# Patient Record
Sex: Male | Born: 1985 | Race: Black or African American | Hispanic: No | Marital: Single | Smoking: Former smoker
Health system: Southern US, Community
[De-identification: ages and names within clinical notes are randomized; demographics above are authoritative.]

## PROBLEM LIST (undated history)

## (undated) DIAGNOSIS — I219 Acute myocardial infarction, unspecified: Secondary | ICD-10-CM

## (undated) HISTORY — PX: HERNIA REPAIR: SHX51

---

## 2016-11-20 ENCOUNTER — Emergency Department (HOSPITAL_COMMUNITY)
Admission: EM | Admit: 2016-11-20 | Discharge: 2016-11-20 | Disposition: A | Discharge status: Home / Self Care | Payer: Managed Care, Other (non HMO) | Attending: Emergency Medicine | Admitting: Emergency Medicine

## 2016-11-20 ENCOUNTER — Encounter (HOSPITAL_COMMUNITY): Payer: Self-pay | Admitting: *Deleted

## 2016-11-20 DIAGNOSIS — M542 Cervicalgia: Secondary | ICD-10-CM | POA: Diagnosis not present

## 2016-11-20 DIAGNOSIS — I252 Old myocardial infarction: Secondary | ICD-10-CM | POA: Diagnosis not present

## 2016-11-20 DIAGNOSIS — Z87891 Personal history of nicotine dependence: Secondary | ICD-10-CM | POA: Insufficient documentation

## 2016-11-20 DIAGNOSIS — M546 Pain in thoracic spine: Secondary | ICD-10-CM | POA: Diagnosis not present

## 2016-11-20 DIAGNOSIS — I10 Essential (primary) hypertension: Secondary | ICD-10-CM

## 2016-11-20 DIAGNOSIS — M549 Dorsalgia, unspecified: Secondary | ICD-10-CM

## 2016-11-20 HISTORY — DX: Acute myocardial infarction, unspecified: I21.9

## 2016-11-20 MED ORDER — MELOXICAM 15 MG PO TABS: 15.0000 mg | ORAL_TABLET | Freq: Every day | ORAL | 0 refills | Status: DC | PRN
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
## 2016-11-20 NOTE — ED Triage Notes (Signed)
Pt reports mid back pain and neck pain for weeks, thinks its related to heavy lifting at work. Denies specific injury. Is ambulatory at triage.

## 2016-11-20 NOTE — ED Notes (Signed)
Right care pamphlet given to patient with discharge paperwork

## 2016-11-20 NOTE — Discharge Instructions (Signed)
[facility] [address], [address] [place], [place] [address] [phone]   SEEK IMMEDIATE MEDICAL ATTENTION IF: New numbness, tingling, weakness, or problem with the use of your arms or legs.  Severe back pain not relieved with medications.  Change in bowel or bladder control.  Increasing pain in any areas of the body (such as chest or abdominal pain).  Shortness of breath, dizziness or fainting.  Nausea (feeling sick to your stomach), vomiting, fever, or sweats.

## 2018-02-20 ENCOUNTER — Emergency Department (HOSPITAL_COMMUNITY)
Admission: EM | Admit: 2018-02-20 | Discharge: 2018-02-20 | Disposition: A | Discharge status: Home / Self Care | Payer: Managed Care, Other (non HMO) | Attending: Emergency Medicine | Admitting: Emergency Medicine

## 2018-02-20 ENCOUNTER — Encounter (HOSPITAL_COMMUNITY): Payer: Self-pay

## 2018-02-20 DIAGNOSIS — R51 Headache: Secondary | ICD-10-CM | POA: Insufficient documentation

## 2018-02-20 DIAGNOSIS — R519 Headache, unspecified: Secondary | ICD-10-CM

## 2018-02-20 DIAGNOSIS — I252 Old myocardial infarction: Secondary | ICD-10-CM | POA: Insufficient documentation

## 2018-02-20 DIAGNOSIS — Z87891 Personal history of nicotine dependence: Secondary | ICD-10-CM | POA: Insufficient documentation

## 2018-02-20 NOTE — ED Provider Notes (Signed)
[facility] Provider Note   CSN: [id number] Arrival date & time: 02/20/18  1121     History   Chief Complaint Chief Complaint  Patient presents with  . Headache    HPI Thomas Tucker is a 32 y.o. male.  The history is provided by the patient. No language interpreter was used.  Headache       32 year old male with history of prior MI presenting for evaluation of headache.  Patient report having a throbbing frontal headache earlier today while at work that has since resolved.  However he is here at the recommendation of his supervisor to ensure that he is medically cleared to return back to work.  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  He has been working outside 10 hours a day all week long and today his headache began after being outside in the heat for approximately 7 hours.  He did receive an Aleve from his supervisor and after resting for approximately 1 hour his headache is mostly resolved.  He denies any associated fever, vision changes, confusion, nausea, lightheadedness, dizziness, neck pain, chest pain, trouble breathing, abdominal pain, focal numbness or weakness or rash.  No prior history of IV drug use active cancer.  He mentioned his prior MI back in 2014 was actually coronary spasm after vascular study.  He has no other complications since.  Past Medical History:  Diagnosis Date  . Myocardial infarction (HCC)     There are no active problems to display for this patient.   History reviewed. No pertinent surgical history.      Home Medications    Prior to Admission medications   Not on File    Family History No family history on file.  Social History Social History   Tobacco Use  . Smoking status: Former Smoker  Substance Use Topics  . Alcohol use: Yes  . Drug use: Yes    Types: Marijuana     Allergies   Patient has no known allergies.   Review of Systems Review of Systems    Neurological: Positive for headaches.  All other systems reviewed and are negative.    Physical Exam Updated Vital Signs BP (!) 126/96 (BP Location: Right Arm)   Pulse (!) 58   Temp 98.1 F (36.7 C) (Oral)   Resp 16   SpO2 100%   Physical Exam  Constitutional: He is oriented to person, place, and time. He appears well-developed and well-nourished. No distress.  HENT:  Head: Atraumatic.  Eyes: Conjunctivae and EOM are normal.  Neck: Normal range of motion. Neck supple. No neck rigidity.  Cardiovascular: Normal rate and regular rhythm.  Pulmonary/Chest: Effort normal and breath sounds normal.  Abdominal: Soft. There is no tenderness.  Musculoskeletal: Normal range of motion.  Neurological: He is alert and oriented to person, place, and time. He has normal strength. He displays normal reflexes. No cranial nerve deficit or sensory deficit. He displays a negative Romberg sign. GCS eye subscore is 4. GCS verbal subscore is 5. GCS motor subscore is 6.  Skin: Skin is warm. No rash noted.  Psychiatric: He has a normal mood and affect.  Nursing note and vitals reviewed.    ED Treatments / Results  Labs (all labs ordered are listed, but only abnormal results are displayed) Labs Reviewed - No data to display  EKG None  Radiology No results found.  Procedures Procedures (including critical care time)  Medications Ordered in ED  Medications - No data to display   Initial Impression / Assessment and Plan / ED Course  I have reviewed the triage vital signs and the nursing notes.  Pertinent labs & imaging results that were available during my care of the patient were reviewed by me and considered in my medical decision making (see chart for details).     BP (!) 126/96 (BP Location: Right Arm)   Pulse (!) 58   Temp 98.1 F (36.7 C) (Oral)   Resp 16   SpO2 100%    Final Clinical Impressions(s) / ED Diagnoses   Final diagnoses:  Bad headache    ED Discharge Orders     None     1:45 PM Patient here with headache likely tension headache.  He has no focal neuro deficit on exam.  No concerning red flags.  His headache is mostly resolved after taking one Advil earlier in the day.  He is stable for discharge.  Work note provided.  He may return to work without restriction.   Fayrene Helperran, Jacqulynn Shappell, PA-C 02/20/18 1348    Vanetta MuldersZackowski, Scott, MD 02/25/18 [phone]

## 2018-02-20 NOTE — ED Triage Notes (Signed)
Pt presents for evaluation of headache starting today. Pt reports he works outside and started having a headache, took aleve and it decreased from 7/10 to 4/10. His job wanted him evaluated.

## 2018-03-24 ENCOUNTER — Other Ambulatory Visit: Payer: Self-pay

## 2018-03-24 ENCOUNTER — Encounter (HOSPITAL_COMMUNITY): Payer: Self-pay | Admitting: Emergency Medicine

## 2018-03-24 ENCOUNTER — Ambulatory Visit (HOSPITAL_COMMUNITY)
Admission: EM | Admit: 2018-03-24 | Discharge: 2018-03-24 | Disposition: A | Discharge status: Home / Self Care | Payer: Self-pay | Attending: Family Medicine | Admitting: Family Medicine

## 2018-03-24 DIAGNOSIS — S39012A Strain of muscle, fascia and tendon of lower back, initial encounter: Secondary | ICD-10-CM

## 2018-03-24 DIAGNOSIS — L84 Corns and callosities: Secondary | ICD-10-CM

## 2018-03-24 MED ORDER — TRIAMCINOLONE ACETONIDE 0.1 % EX CREA: 1.0000 "application " | TOPICAL_CREAM | Freq: Two times a day (BID) | CUTANEOUS | 1 refills | Status: DC

## 2018-03-24 MED ORDER — DICLOFENAC SODIUM 75 MG PO TBEC: 75.0000 mg | DELAYED_RELEASE_TABLET | ORAL | 1 refills | Status: DC

## 2018-03-24 NOTE — ED Triage Notes (Signed)
Complains of bilateral foot pain and back pain.  Pain for 2-3 months.  Pains seemed worse over the past 2-3 days.  Patient says lower back hurts and bilateral heel pain, but no radiation of pain down the leg

## 2018-03-24 NOTE — ED Provider Notes (Signed)
[facility]    CSN: [id number] Arrival date & time: 03/24/18  1112     History   Chief Complaint Chief Complaint  Patient presents with  . Back Pain    HPI Thomas Tucker is a 32 y.o. male.   Complains of bilateral foot pain and back pain.  Pain for 2-3 months.  Pains seemed worse over the past 2-3 days.  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  He finds that he is developing calluses on his feet and some of these are cracking, particularly on the left heel.  He has mild low back pain as well.  Patient had a heart attack when he was 24 and was told that this was because of smoking.  He said that they did studies showing there was no blockage.   Note from 02/20/18: 32 year old male with history of prior MI presenting for evaluation of headache.  Patient report having a throbbing frontal headache earlier today while at work that has since resolved.  However he is here at the recommendation of his supervisor to ensure that he is medically cleared to return back to work.  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  He has been working outside 10 hours a day all week long and today his headache began after being outside in the heat for approximately 7 hours.  He did receive an Aleve from his supervisor and after resting for approximately 1 hour his headache is mostly resolved.  He denies any associated fever, vision changes, confusion, nausea, lightheadedness, dizziness, neck pain, chest pain, trouble breathing, abdominal pain, focal numbness or weakness or rash.  No prior history of IV drug use active cancer.  He mentioned his prior MI back in 2014 was actually coronary spasm after vascular study.  He has no other complications since.     Past Medical History:  Diagnosis Date  . Myocardial infarction (HCC)     There are no active  problems to display for this patient.   History reviewed. No pertinent surgical history.     Home Medications    Prior to Admission medications   Medication Sig Start Date End Date Taking? Authorizing Provider  diclofenac (VOLTAREN) 75 MG EC tablet Take 1 tablet (75 mg total) by mouth every morning. 03/24/18   Elvina Sidle, MD  triamcinolone cream (KENALOG) 0.1 % Apply 1 application topically 2 (two) times daily. 03/24/18   Elvina Sidle, MD    Family History Family History  Problem Relation Age of Onset  . Healthy Mother   . Healthy Father     Social History Social History   Tobacco Use  . Smoking status: Former Smoker  Substance Use Topics  . Alcohol use: Yes  . Drug use: Yes    Types: Marijuana     Allergies   Patient has no known allergies.   Review of Systems Review of Systems   Physical Exam Triage Vital Signs ED Triage Vitals  Enc Vitals Group     BP 03/24/18 1207 (!) 141/94     Pulse Rate 03/24/18 1207 (!) 58     Resp 03/24/18 1207 16     Temp 03/24/18 1207 97.9 F (36.6 C)     Temp Source 03/24/18 1207 Oral     SpO2 03/24/18 1207 100 %     Weight --  Height --      Head Circumference --      Peak Flow --      Pain Score 03/24/18 1210 8     Pain Loc --      Pain Edu? --      Excl. in GC? --    No data found.  Updated Vital Signs BP (!) 141/94 (BP Location: Left Arm)   Pulse (!) 58   Temp 97.9 F (36.6 C) (Oral)   Resp 16   SpO2 100%    Physical Exam  Constitutional: He is oriented to person, place, and time. He appears well-developed and well-nourished.  HENT:  Right Ear: External ear normal.  Left Ear: External ear normal.  Mouth/Throat: Oropharynx is clear and moist.  Eyes: Pupils are equal, round, and reactive to light. Conjunctivae are normal.  Neck: Normal range of motion. Neck supple.  Pulmonary/Chest: Effort normal.  Musculoskeletal: Normal range of motion.  Normal lumbar curve.  No localized deformity or  tenderness.  Neurological: He is alert and oriented to person, place, and time.  Skin: Skin is warm and dry. No rash noted.  Thick calluses on feet with some fissuring left heel  Psychiatric: He has a normal mood and affect.  Nursing note and vitals reviewed.    UC Treatments / Results  Labs (all labs ordered are listed, but only abnormal results are displayed) Labs Reviewed - No data to display  EKG None  Radiology No results found.  Procedures Procedures (including critical care time)  Medications Ordered in UC Medications - No data to display  Initial Impression / Assessment and Plan / UC Course  I have reviewed the triage vital signs and the nursing notes.  Pertinent labs & imaging results that were available during my care of the patient were reviewed by me and considered in my medical decision making (see chart for details).    Final Clinical Impressions(s) / UC Diagnoses   Final diagnoses:  Callus of foot  Strain of lumbar region, initial encounter   Discharge Instructions   None    ED Prescriptions    Medication Sig Dispense Auth. Provider   diclofenac (VOLTAREN) 75 MG EC tablet Take 1 tablet (75 mg total) by mouth every morning. 30 tablet Elvina SidleLauenstein, Zayd Bonet, MD   triamcinolone cream (KENALOG) 0.1 % Apply 1 application topically 2 (two) times daily. 80 g Elvina SidleLauenstein, Lyncoln Ledgerwood, MD     Controlled Substance Prescriptions [place] Controlled Substance Registry consulted? Not Applicable   Elvina SidleLauenstein, Maimouna Rondeau, MD 03/24/18 1233

## 2018-05-28 ENCOUNTER — Other Ambulatory Visit: Payer: Self-pay

## 2018-05-28 ENCOUNTER — Encounter (HOSPITAL_COMMUNITY): Payer: Self-pay | Admitting: Emergency Medicine

## 2018-05-28 ENCOUNTER — Ambulatory Visit (HOSPITAL_COMMUNITY)
Admission: EM | Admit: 2018-05-28 | Discharge: 2018-05-28 | Disposition: A | Discharge status: Home / Self Care | Payer: Self-pay | Attending: Family Medicine | Admitting: Family Medicine

## 2018-05-28 DIAGNOSIS — S161XXA Strain of muscle, fascia and tendon at neck level, initial encounter: Secondary | ICD-10-CM

## 2018-05-28 DIAGNOSIS — M542 Cervicalgia: Secondary | ICD-10-CM

## 2018-05-28 MED ORDER — TIZANIDINE HCL 4 MG PO TABS: 4.0000 mg | ORAL_TABLET | Freq: Four times a day (QID) | ORAL | 0 refills | Status: DC | PRN

## 2018-05-28 MED ORDER — IBUPROFEN 800 MG PO TABS: 800.0000 mg | ORAL_TABLET | Freq: Three times a day (TID) | ORAL | 0 refills | Status: DC
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
## 2018-05-28 NOTE — Discharge Instructions (Signed)
Take the ibuprofen 3 x a day with food Tizanidine as a muscle relaxer.  Take as needed for stiff sore muscles.  This is useful at night Ice or heat to area.  Gentle stretching. Expect improvement over 2 to 3 days. Return promptly for any worsening symptoms, or if you fail to improve
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
## 2019-06-23 ENCOUNTER — Ambulatory Visit (HOSPITAL_COMMUNITY)
Admission: EM | Admit: 2019-06-23 | Discharge: 2019-06-23 | Disposition: A | Discharge status: Home / Self Care | Payer: Self-pay | Attending: Emergency Medicine | Admitting: Emergency Medicine

## 2019-06-23 ENCOUNTER — Other Ambulatory Visit: Payer: Self-pay

## 2019-06-23 ENCOUNTER — Encounter (HOSPITAL_COMMUNITY): Payer: Self-pay

## 2019-06-23 DIAGNOSIS — K0510 Chronic gingivitis, plaque induced: Secondary | ICD-10-CM

## 2019-06-23 DIAGNOSIS — K1379 Other lesions of oral mucosa: Secondary | ICD-10-CM

## 2019-06-23 MED ORDER — IBUPROFEN 800 MG PO TABS: 800.0000 mg | ORAL_TABLET | Freq: Three times a day (TID) | ORAL | 0 refills | Status: DC

## 2019-06-23 MED ORDER — AMOXICILLIN 500 MG PO CAPS: 500.0000 mg | ORAL_CAPSULE | Freq: Three times a day (TID) | ORAL | 0 refills | Status: AC

## 2019-06-23 MED ORDER — TRIAMCINOLONE ACETONIDE 0.1 % MT PSTE: 1.0000 "application " | PASTE | Freq: Two times a day (BID) | OROMUCOSAL | 0 refills | Status: DC

## 2019-06-23 NOTE — Discharge Instructions (Addendum)
Please begin taking amoxicillin for the next week to treat for infection contributing to discomfort You may also try applying triamcinolone paste twice daily to area Use anti-inflammatories for pain/swelling. You may take up to 800 mg Ibuprofen every 8 hours with food. You may supplement Ibuprofen with Tylenol [phone] mg every 8 hours.   Follow up if not getting better or worsening

## 2019-06-23 NOTE — ED Triage Notes (Signed)
Pt presents with mouth sore on the inside of right side of his mouth X 2 days.

## 2019-06-23 NOTE — ED Provider Notes (Signed)
[facility]    CSN: [id number] Arrival date & time: 06/23/19  0901      History   Chief Complaint Chief Complaint  Patient presents with  . Mouth Sores    HPI Thomas Tucker is a 33 y.o. male history of previous MI presenting today for evaluation of a oral sore.  Patient states that over the past 2 days he has had increased pain to become of his right upper jaw.  He denies any specific trauma.  The pain has progressively worsened since onset.  Denies other lesions.  Denies sore throat or difficulty swallowing.  Denies fevers or neck pain.  Denies significant swelling.  He believes there may be a sore.  He has tried Aleve, salt water gargles.  Does note that his teeth are in bad condition.  HPI  Past Medical History:  Diagnosis Date  . Myocardial infarction ([place])     There are no problems to display for this patient.   History reviewed. No pertinent surgical history.     Home Medications    Prior to Admission medications   Medication Sig Start Date End Date Taking? Authorizing Provider  amoxicillin (AMOXIL) 500 MG capsule Take 1 capsule (500 mg total) by mouth 3 (three) times daily for 7 days. 06/23/19 06/30/19  Jery Hollern C, PA-C  ibuprofen (ADVIL) 800 MG tablet Take 1 tablet (800 mg total) by mouth 3 (three) times daily. 06/23/19   Mykala Mccready C, PA-C  tiZANidine (ZANAFLEX) 4 MG tablet Take 1 tablet (4 mg total) by mouth every 6 (six) hours as needed for muscle spasms. 05/28/18   Raylene Everts, MD  triamcinolone (KENALOG) 0.1 % paste Use as directed 1 application in the mouth or throat 2 (two) times daily. 06/23/19   Aribelle Mccosh C, PA-C  triamcinolone cream (KENALOG) 0.1 % Apply 1 application topically 2 (two) times daily. 03/24/18   Robyn Haber, MD    Family History Family History  Problem Relation Age of Onset  . Healthy Mother   . Healthy Father     Social History Social History   Tobacco Use  . Smoking status: Former Smoker   Substance Use Topics  . Alcohol use: Yes  . Drug use: Yes    Types: Marijuana     Allergies   Patient has no known allergies.   Review of Systems Review of Systems  Constitutional: Negative for activity change, appetite change, chills, fatigue and fever.  HENT: Positive for dental problem and mouth sores. Negative for congestion, ear pain, rhinorrhea, sinus pressure, sore throat and trouble swallowing.   Eyes: Negative for discharge and redness.  Respiratory: Negative for cough, chest tightness and shortness of breath.   Cardiovascular: Negative for chest pain.  Gastrointestinal: Negative for abdominal pain, diarrhea, nausea and vomiting.  Musculoskeletal: Negative for myalgias.  Skin: Negative for rash.  Neurological: Negative for dizziness, light-headedness and headaches.     Physical Exam Triage Vital Signs ED Triage Vitals  Enc Vitals Group     BP 06/23/19 0913 (!) 152/95     Pulse Rate 06/23/19 0913 80     Resp 06/23/19 0913 18     Temp 06/23/19 0913 98.2 F (36.8 C)     Temp Source 06/23/19 0913 Oral     SpO2 06/23/19 0913 98 %     Weight --      Height --      Head Circumference --      Peak Flow --  Pain Score 06/23/19 0914 6     Pain Loc --      Pain Edu? --      Excl. in GC? --    No data found.  Updated Vital Signs BP (!) 152/95 (BP Location: Right Arm)   Pulse 80   Temp 98.2 F (36.8 C) (Oral)   Resp 18   SpO2 98%   Visual Acuity Right Eye Distance:   Left Eye Distance:   Bilateral Distance:    Right Eye Near:   Left Eye Near:    Bilateral Near:     Physical Exam Vitals and nursing note reviewed.  Constitutional:      Appearance: He is well-developed.     Comments: No acute distress  HENT:     Head: Normocephalic and atraumatic.     Nose: Nose normal.     Mouth/Throat:     Comments: Dentition in poor repair Tenderness to palpation of gingiva above anterior molar on right upper jaw, does appear to have small area of breakdown,  possible small cut to this area.  No obvious aphthous ulcers.  No soft palate swelling, posterior pharynx patent, uvula midline without swelling Eyes:     Conjunctiva/sclera: Conjunctivae normal.  Neck:     Comments: Full active range of motion of neck, and no neck swelling or erythema Cardiovascular:     Rate and Rhythm: Normal rate.  Pulmonary:     Effort: Pulmonary effort is normal. No respiratory distress.  Abdominal:     General: There is no distension.  Musculoskeletal:        General: Normal range of motion.     Cervical back: Neck supple.  Skin:    General: Skin is warm and dry.  Neurological:     Mental Status: He is alert and oriented to person, place, and time.      UC Treatments / Results  Labs (all labs ordered are listed, but only abnormal results are displayed) Labs Reviewed - No data to display  EKG   Radiology No results found.  Procedures Procedures (including critical care time)  Medications Ordered in UC Medications - No data to display  Initial Impression / Assessment and Plan / UC Course  I have reviewed the triage vital signs and the nursing notes.  Pertinent labs & imaging results that were available during my care of the patient were reviewed by me and considered in my medical decision making (see chart for details).     Given gingival tenderness just above border of tooth will go to cover for dental infection with amoxicillin, also will provide triamcinolone paste to apply topically to help with discomfort.  Continue NSAIDs.  Continue to monitor for gradual healing.Discussed strict return precautions. Patient verbalized understanding and is agreeable with plan.  Final Clinical Impressions(s) / UC Diagnoses   Final diagnoses:  Gingivitis  Mouth sore     Discharge Instructions     Please begin taking amoxicillin for the next week to treat for infection contributing to discomfort You may also try applying triamcinolone paste twice  daily to area Use anti-inflammatories for pain/swelling. You may take up to 800 mg Ibuprofen every 8 hours with food. You may supplement Ibuprofen with Tylenol [phone] mg every 8 hours.   Follow up if not getting better or worsening   ED Prescriptions    Medication Sig Dispense Auth. Provider   amoxicillin (AMOXIL) 500 MG capsule Take 1 capsule (500 mg total) by mouth 3 (three) times daily  for 7 days. 21 capsule Georgie Haque C, PA-C   ibuprofen (ADVIL) 800 MG tablet Take 1 tablet (800 mg total) by mouth 3 (three) times daily. 21 tablet Michaelia Beilfuss C, PA-C   triamcinolone (KENALOG) 0.1 % paste Use as directed 1 application in the mouth or throat 2 (two) times daily. 5 g Janicia Monterrosa, [place] Est[place] C, PA-C     PDMP not reviewed this encounter.   Lew DawesWieters, Bentlee Drier C, [place] JerseyPA-C 06/23/19 [phone]

## 2019-08-17 ENCOUNTER — Emergency Department (HOSPITAL_COMMUNITY)
Admission: EM | Admit: 2019-08-17 | Discharge: 2019-08-17 | Disposition: A | Discharge status: Home / Self Care | Payer: Self-pay | Attending: Emergency Medicine | Admitting: Emergency Medicine

## 2019-08-17 ENCOUNTER — Other Ambulatory Visit: Payer: Self-pay

## 2019-08-17 ENCOUNTER — Encounter (HOSPITAL_COMMUNITY): Payer: Self-pay | Admitting: Emergency Medicine

## 2019-08-17 DIAGNOSIS — Y9389 Activity, other specified: Secondary | ICD-10-CM | POA: Insufficient documentation

## 2019-08-17 DIAGNOSIS — Y990 Civilian activity done for income or pay: Secondary | ICD-10-CM | POA: Insufficient documentation

## 2019-08-17 DIAGNOSIS — Y929 Unspecified place or not applicable: Secondary | ICD-10-CM | POA: Insufficient documentation

## 2019-08-17 DIAGNOSIS — S39012A Strain of muscle, fascia and tendon of lower back, initial encounter: Secondary | ICD-10-CM | POA: Insufficient documentation

## 2019-08-17 DIAGNOSIS — X58XXXA Exposure to other specified factors, initial encounter: Secondary | ICD-10-CM | POA: Insufficient documentation

## 2019-08-17 MED ORDER — METHOCARBAMOL 500 MG PO TABS: 500.0000 mg | ORAL_TABLET | Freq: Two times a day (BID) | ORAL | 0 refills | Status: DC

## 2019-08-17 NOTE — ED Notes (Signed)
Patient Alert and oriented to baseline. Stable and ambulatory to baseline. Patient verbalized understanding of the discharge instructions.  Patient belongings were taken by the patient.   

## 2019-08-17 NOTE — Discharge Instructions (Signed)
Please use Tylenol or ibuprofen for pain.  You may use 600 mg ibuprofen every 6 hours or 1000 mg of Tylenol every 6 hours.  You may choose to alternate between the 2.  This would be most effective.  Not to exceed 4 g of Tylenol within 24 hours.  Not to exceed 3200 mg ibuprofen 24 hours.  

## 2019-08-17 NOTE — ED Triage Notes (Signed)
Pt in w/sharp low back pain x 5 days. States the pain started at work. He drives forklifts and lifts heavy items. Denies any urinary problems or leg numbness/tingling. Took excedrin PTA, pain 4/10

## 2019-08-17 NOTE — ED Provider Notes (Signed)
[facility] EMERGENCY DEPARTMENT Provider Note   CSN: [id number] Arrival date & time: 08/17/19  5053     History Chief Complaint  Patient presents with  . Back Pain    Aztlan Coll is a 34 y.o. male with no pertinent past medical history  HPI Patient is a 34 year old gentleman presented today with low back pain that was sudden onset when he was at work 5 days ago.  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  He states that it feels like an ache that is constant worse with moving heavy lifting.  Denies any radiation of pain and denies any numbness, weakness, difficulty walking, bowel or bladder incontinence, saddle anesthesia, fever, IV drug use, cancer, blood thinner use.  Patient states that he took Excedrin with mild improvement of symptoms however he states that every morning over the past 5 days he has had a tightness in his lower back.  States he took Excedrin this morning with mild relief.  Denies abdominal pain, chest pain, nausea, vomiting, diaphoresis, fevers, chills, weakness, numbness lightheadedness or dizziness.    Past Medical History:  Diagnosis Date  . Myocardial infarction ([place])     There are no problems to display for this patient.   History reviewed. No pertinent surgical history.     Family History  Problem Relation Age of Onset  . Healthy Mother   . Healthy Father     Social History   Tobacco Use  . Smoking status: Former Research scientist (life sciences)  . Smokeless tobacco: Never Used  Substance Use Topics  . Alcohol use: Yes  . Drug use: Yes    Types: Marijuana    Home Medications Prior to Admission medications   Medication Sig Start Date End Date Taking? Authorizing Provider  ibuprofen (ADVIL) 800 MG tablet Take 1 tablet (800 mg total) by mouth 3 (three) times daily. 06/23/19   Wieters, Hallie C, PA-C  methocarbamol (ROBAXIN) 500 MG  tablet Take 1 tablet (500 mg total) by mouth 2 (two) times daily. 08/17/19   Tedd Sias, PA  tiZANidine (ZANAFLEX) 4 MG tablet Take 1 tablet (4 mg total) by mouth every 6 (six) hours as needed for muscle spasms. 05/28/18   Raylene Everts, MD  triamcinolone (KENALOG) 0.1 % paste Use as directed 1 application in the mouth or throat 2 (two) times daily. 06/23/19   Wieters, Hallie C, PA-C  triamcinolone cream (KENALOG) 0.1 % Apply 1 application topically 2 (two) times daily. 03/24/18   Robyn Haber, MD    Allergies    Patient has no known allergies.  Review of Systems   Review of Systems  Constitutional: Negative for chills and fever.  HENT: Negative for congestion.   Eyes: Negative for pain.  Respiratory: Negative for cough and shortness of breath.   Cardiovascular: Negative for chest pain and leg swelling.  Gastrointestinal: Negative for abdominal pain and vomiting.  Genitourinary: Negative for dysuria.  Musculoskeletal: Positive for back pain. Negative for myalgias.  Skin: Negative for rash.  Neurological: Negative for dizziness and headaches.    Physical Exam Updated Vital Signs BP 137/83 (BP Location: Right Arm)   Pulse 72   Temp 98.5 F (36.9 C) (Oral)   Resp 16   Wt 70.8 kg   SpO2 100%   Physical Exam Vitals and nursing note reviewed.  Constitutional:  General: He is not in acute distress. HENT:     Head: Normocephalic and atraumatic.     Nose: Nose normal.  Eyes:     General: No scleral icterus. Cardiovascular:     Rate and Rhythm: Normal rate.     Pulses: Normal pulses.     Comments: DP/PT pulses 3+ symmetric Pulmonary:     Effort: Pulmonary effort is normal. No respiratory distress.  Abdominal:     Palpations: Abdomen is soft.     Tenderness: There is no abdominal tenderness.  Musculoskeletal:     Cervical back: Normal range of motion.     Right lower leg: No edema.     Left lower leg: No edema.     Comments: Paralumbar muscular tenderness  that is reproducible.  No midline tenderness.  Full range of motion of spine.  Skin:    General: Skin is warm and dry.     Capillary Refill: Capillary refill takes less than 2 seconds.  Neurological:     General: No focal deficit present.     Mental Status: He is alert. Mental status is at baseline.     Comments: Cranial nerves are unremarkable.  No sensory deficits Strength WNL in all 4 extremities Gait normal  Psychiatric:        Mood and Affect: Mood normal.        Behavior: Behavior normal.     ED Results / Procedures / Treatments   Labs (all labs ordered are listed, but only abnormal results are displayed) Labs Reviewed - No data to display  EKG None  Radiology No results found.  Procedures Procedures (including critical care time)  Medications Ordered in ED Medications - No data to display  ED Course  I have reviewed the triage vital signs and the nursing notes.  Pertinent labs & imaging results that were available during my care of the patient were reviewed by me and considered in my medical decision making (see chart for details).    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  Is was sudden onset while at work with heavy labor.  Has been aching in the morning since then.  No associated symptoms/red flags.  No blood thinner use or neurologic symptoms.  Patient will follow up with his primary care doctor.  Given Robaxin and recommendations for ibuprofen and Tylenol use.  Given stretches and recommendations for warm compresses and gentle exercises.  Provided with work note.  The emergent differential diagnosis for back pain includes but is not limited to fracture, muscle strain, cauda equina, spinal stenosis. DDD, ankylosing spondylitis, acute ligamentous injury, disk herniation, spondylolisthesis, Epidural compression syndrome, metastatic cancer, transverse  myelitis, vertebral osteomyelitis, diskitis, kidney stone, pyelonephritis, AAA, Perforated ulcer, Retrocecal appendicitis, pancreatitis, bowel obstruction, retroperitoneal hemorrhage or mass, meningitis.  Doubt that he is experiencing any emergent cause of back pain--suspect that this is musculoskeletal in origin I recommended follow-up with PCP for reevaluation.  Donzel Romack was evaluated in Emergency Department on 08/17/2019 for the symptoms described in the history of present illness. He was evaluated in the context of the global COVID-19 pandemic, which necessitated consideration that the patient might be at risk for infection with the SARS-CoV-2 virus that causes COVID-19. Institutional protocols and algorithms that pertain to the evaluation of patients at risk for COVID-19 are  in a state of rapid change based on information released by regulatory bodies including the CDC and federal and state organizations. These policies and algorithms were followed during the patient's care in the ED.  Final Clinical Impression(s) / ED Diagnoses Final diagnoses:  Strain of lumbar region, initial encounter    Rx / DC Orders ED Discharge Orders         Ordered    methocarbamol (ROBAXIN) 500 MG tablet  2 times daily     08/17/19 0720           Solon Augusta [place], [place] 08/17/19 4481    Margarita Grizzle, MD 08/17/19 [phone]

## 2019-11-21 ENCOUNTER — Other Ambulatory Visit: Payer: Self-pay

## 2019-11-21 ENCOUNTER — Emergency Department (HOSPITAL_COMMUNITY)
Admission: EM | Admit: 2019-11-21 | Discharge: 2019-11-21 | Disposition: A | Discharge status: Home / Self Care | Payer: Self-pay | Attending: Emergency Medicine | Admitting: Emergency Medicine

## 2019-11-21 ENCOUNTER — Encounter (HOSPITAL_COMMUNITY): Payer: Self-pay | Admitting: Emergency Medicine

## 2019-11-21 DIAGNOSIS — Z79899 Other long term (current) drug therapy: Secondary | ICD-10-CM | POA: Insufficient documentation

## 2019-11-21 DIAGNOSIS — K0889 Other specified disorders of teeth and supporting structures: Secondary | ICD-10-CM

## 2019-11-21 DIAGNOSIS — K029 Dental caries, unspecified: Secondary | ICD-10-CM | POA: Insufficient documentation

## 2019-11-21 DIAGNOSIS — Z87891 Personal history of nicotine dependence: Secondary | ICD-10-CM | POA: Insufficient documentation

## 2019-11-21 MED ORDER — IBUPROFEN 200 MG PO TABS
600.0000 mg | ORAL_TABLET | Freq: Once | ORAL | Status: AC
  Administered 2019-11-21: 600 mg via ORAL
  Filled 2019-11-21: qty 1

## 2019-11-21 MED ORDER — IBUPROFEN 800 MG PO TABS: 800.0000 mg | ORAL_TABLET | Freq: Three times a day (TID) | ORAL | 0 refills | Status: DC

## 2019-11-21 MED ORDER — PENICILLIN V POTASSIUM 500 MG PO TABS: 500.0000 mg | ORAL_TABLET | Freq: Three times a day (TID) | ORAL | 0 refills | Status: DC

## 2019-11-21 MED ORDER — PENICILLIN V POTASSIUM 250 MG PO TABS
500.0000 mg | ORAL_TABLET | Freq: Once | ORAL | Status: AC
  Administered 2019-11-21: 500 mg via ORAL
  Filled 2019-11-21: qty 2

## 2019-11-21 NOTE — ED Triage Notes (Signed)
Pt c/o right sided dental pain that radiates to his ear. Has tried OTC medications with no relief.

## 2019-11-21 NOTE — Discharge Instructions (Signed)
Take medications as prescribed. Follow up with dentistry with the resources provided in your discharge instructions.

## 2019-11-21 NOTE — ED Provider Notes (Signed)
[facility] EMERGENCY DEPARTMENT Provider Note   CSN: [id number] Arrival date & time: 11/21/19  1615     History Chief Complaint  Patient presents with  . Dental Pain    Thomas Tucker is a 34 y.o. male.  Patient to ED with complaint of dental pain for the past several days. He reports pain is recurrent in right upper teeth and new in the right rear molar area. No facial swelling, fever or difficulty swallowing.   The history is provided by the patient. No language interpreter was used.  Dental Pain Associated symptoms: no facial swelling and no fever        Past Medical History:  Diagnosis Date  . Myocardial infarction ([place])     There are no problems to display for this patient.   History reviewed. No pertinent surgical history.     Family History  Problem Relation Age of Onset  . Healthy Mother   . Healthy Father     Social History   Tobacco Use  . Smoking status: Former Research scientist (life sciences)  . Smokeless tobacco: Never Used  Substance Use Topics  . Alcohol use: Yes  . Drug use: Yes    Types: Marijuana    Home Medications Prior to Admission medications   Medication Sig Start Date End Date Taking? Authorizing Provider  ibuprofen (ADVIL) 800 MG tablet Take 1 tablet (800 mg total) by mouth 3 (three) times daily. 06/23/19   Wieters, Hallie C, PA-C  methocarbamol (ROBAXIN) 500 MG tablet Take 1 tablet (500 mg total) by mouth 2 (two) times daily. 08/17/19   Tedd Sias, PA  tiZANidine (ZANAFLEX) 4 MG tablet Take 1 tablet (4 mg total) by mouth every 6 (six) hours as needed for muscle spasms. 05/28/18   Raylene Everts, MD  triamcinolone (KENALOG) 0.1 % paste Use as directed 1 application in the mouth or throat 2 (two) times daily. 06/23/19   Wieters, Hallie C, PA-C  triamcinolone cream (KENALOG) 0.1 % Apply 1 application topically 2 (two) times daily. 03/24/18   Robyn Haber, MD    Allergies    Patient has no known allergies.  Review of Systems    Review of Systems  Constitutional: Negative for fever.  HENT: Positive for dental problem. Negative for facial swelling, sore throat and trouble swallowing.   Gastrointestinal: Negative for nausea.    Physical Exam Updated Vital Signs BP (!) 184/103 (BP Location: Left Arm)   Pulse 66   Temp 98.6 F (37 C) (Oral)   Resp 20   SpO2 100%   Physical Exam Constitutional:      General: He is not in acute distress.    Appearance: Normal appearance. He is well-developed.  HENT:     Mouth/Throat:     Comments: Poor dentition overall. Multiple areas of decay. Right upper 1st and 2nd molar discolored with evidence of gingival disease. Left rear molar area unremarkable in appearance. No visualized abscess.  No facial swelling.  Pulmonary:     Effort: Pulmonary effort is normal.  Musculoskeletal:        General: Normal range of motion.     Cervical back: Normal range of motion.  Skin:    General: Skin is warm and dry.  Neurological:     Mental Status: He is alert and oriented to person, place, and time.     ED Results / Procedures / Treatments   Labs (all labs ordered are listed, but only abnormal results are displayed) Labs Reviewed -  No data to display  EKG None  Radiology No results found.  Procedures Procedures (including critical care time)  Medications Ordered in ED Medications - No data to display  ED Course  I have reviewed the triage vital signs and the nursing notes.  Pertinent labs & imaging results that were available during my care of the patient were reviewed by me and considered in my medical decision making (see chart for details).    MDM Rules/Calculators/A&P                      Patient to ED with dental pain for several days, recurrent. No fever/facial swelling.  Will start on PCN. Encouraged continued use of ibuprofen in higher doses than the 400 mg he was taking. Will provide dental resources.   Final Clinical Impression(s) / ED  Diagnoses Final diagnoses:  None   1. Dental caries 2. Dental pain  Rx / DC Orders ED Discharge Orders    None       Danne Harbor 11/21/19 1929    Tilden Fossa, MD 11/22/19 [phone]

## 2019-11-21 NOTE — ED Notes (Signed)
Pt verbalized understanding of d/c instructions, follow up care and s/s requiring return to ed. Pt had no further questions and refused wheelchair. Pt ambulated unassisted to exit.  

## 2020-02-21 ENCOUNTER — Emergency Department (HOSPITAL_COMMUNITY): Payer: 59

## 2020-02-21 ENCOUNTER — Emergency Department (HOSPITAL_COMMUNITY)
Admission: EM | Admit: 2020-02-21 | Discharge: 2020-02-21 | Disposition: A | Discharge status: Home / Self Care | Payer: 59 | Attending: Emergency Medicine | Admitting: Emergency Medicine

## 2020-02-21 DIAGNOSIS — Z79891 Long term (current) use of opiate analgesic: Secondary | ICD-10-CM | POA: Diagnosis not present

## 2020-02-21 DIAGNOSIS — M25521 Pain in right elbow: Secondary | ICD-10-CM | POA: Diagnosis present

## 2020-02-21 DIAGNOSIS — Z87891 Personal history of nicotine dependence: Secondary | ICD-10-CM | POA: Insufficient documentation

## 2020-02-21 DIAGNOSIS — W19XXXA Unspecified fall, initial encounter: Secondary | ICD-10-CM
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
## 2020-02-21 NOTE — ED Triage Notes (Signed)
Pt here with c/o right elbow pain after falling on it  While playing basketball yesterday , pt is able to move arm

## 2020-02-21 NOTE — Discharge Instructions (Addendum)
As discussed, your evaluation today has been largely reassuring.  But, it is important that you monitor your condition carefully, and do not hesitate to return to the ED if you develop new, or concerning changes in your condition. ? ?Otherwise, please follow-up with your physician for appropriate ongoing care. ? ?

## 2021-02-14 ENCOUNTER — Other Ambulatory Visit: Payer: Self-pay

## 2021-02-14 ENCOUNTER — Emergency Department (HOSPITAL_COMMUNITY)
Admission: EM | Admit: 2021-02-14 | Discharge: 2021-02-14 | Disposition: A | Discharge status: Home / Self Care | Payer: 59 | Attending: Emergency Medicine | Admitting: Emergency Medicine

## 2021-02-14 ENCOUNTER — Emergency Department (HOSPITAL_COMMUNITY): Payer: 59

## 2021-02-14 ENCOUNTER — Encounter (HOSPITAL_COMMUNITY): Payer: Self-pay | Admitting: *Deleted

## 2021-02-14 DIAGNOSIS — M79671 Pain in right foot: Secondary | ICD-10-CM | POA: Insufficient documentation

## 2021-02-14 DIAGNOSIS — S90414A Abrasion, right lesser toe(s), initial encounter: Secondary | ICD-10-CM | POA: Diagnosis not present

## 2021-02-14 DIAGNOSIS — S99921A Unspecified injury of right foot, initial encounter: Secondary | ICD-10-CM

## 2021-02-14 DIAGNOSIS — Z87891 Personal history of nicotine dependence: Secondary | ICD-10-CM | POA: Insufficient documentation

## 2021-02-14 DIAGNOSIS — W208XXA Other cause of strike by thrown, projected or falling object, initial encounter: Secondary | ICD-10-CM | POA: Insufficient documentation

## 2021-02-14 MED ORDER — OXYCODONE-ACETAMINOPHEN 5-325 MG PO TABS
1.0000 | ORAL_TABLET | ORAL | Status: DC | PRN
  Administered 2021-02-14: 1 via ORAL
  Filled 2021-02-14: qty 1

## 2021-02-14 MED ORDER — BACITRACIN ZINC 500 UNIT/GM EX OINT: TOPICAL_OINTMENT | Freq: Two times a day (BID) | CUTANEOUS | Status: DC

## 2021-02-14 NOTE — ED Notes (Signed)
Ortho tech paged  

## 2021-02-14 NOTE — ED Triage Notes (Signed)
Pt to ED c/o R foot pain and swelling after dropping 50 lbs weight on it.  Initially able to bear weight, but no longer.

## 2021-02-14 NOTE — ED Provider Notes (Signed)
[facility] EMERGENCY DEPARTMENT Provider Note   CSN: [id number] Arrival date & time: 02/14/21  4081     History Chief Complaint  Patient presents with   Foot Injury    Jeray Shugart is a 35 y.o. male who reports himself as otherwise healthy no daily medication use presents today for right foot pain.  Patient reports earlier this morning he was moving a 50 pound dumbbell out of his hallway that his son had left.  He reports that the dumbbell slipped out of his hand landing onto his foot.  Pain was immediate, moderate/severe in intensity, throbbing, constant, worsened with ambulation, improves with rest.  Patient reports 1 Percocet he was given in triage has also helped with his pain.  Denies numbness/tingling, weakness, fever/chills or any additional injuries or concerns.  HPI     Past Medical History:  Diagnosis Date   Myocardial infarction (HCC)     There are no problems to display for this patient.   History reviewed. No pertinent surgical history.     Family History  Problem Relation Age of Onset   Healthy Mother    Healthy Father     Social History   Tobacco Use   Smoking status: Former   Smokeless tobacco: Never  Substance Use Topics   Alcohol use: Yes   Drug use: Yes    Types: Marijuana    Home Medications Prior to Admission medications   Medication Sig Start Date End Date Taking? Authorizing Provider  ibuprofen (ADVIL) 800 MG tablet Take 1 tablet (800 mg total) by mouth 3 (three) times daily. 11/21/19   Elpidio Anis, PA-C  methocarbamol (ROBAXIN) 500 MG tablet Take 1 tablet (500 mg total) by mouth 2 (two) times daily. 08/17/19   Gailen Shelter, PA  penicillin v potassium (VEETID) 500 MG tablet Take 1 tablet (500 mg total) by mouth 3 (three) times daily. 11/21/19   Elpidio Anis, PA-C  tiZANidine (ZANAFLEX) 4 MG tablet Take 1 tablet (4 mg total) by mouth every 6 (six) hours as needed for muscle spasms. 05/28/18   Eustace Moore,  MD  triamcinolone (KENALOG) 0.1 % paste Use as directed 1 application in the mouth or throat 2 (two) times daily. 06/23/19   Wieters, Hallie C, PA-C  triamcinolone cream (KENALOG) 0.1 % Apply 1 application topically 2 (two) times daily. 03/24/18   Elvina Sidle, MD    Allergies    Patient has no known allergies.  Review of Systems   Review of Systems  Constitutional: Negative.  Negative for chills and fatigue.  Musculoskeletal:  Positive for arthralgias.  Neurological: Negative.  Negative for weakness and numbness.   Physical Exam Updated Vital Signs BP (!) 165/98 (BP Location: Right Arm)   Pulse 65   Temp 98.8 F (37.1 C)   Resp 18   Ht 5\' 11"  (1.803 m)   Wt 70.3 kg   SpO2 100%   BMI 21.62 kg/m   Physical Exam Constitutional:      General: He is not in acute distress.    Appearance: Normal appearance. He is well-developed. He is not ill-appearing or diaphoretic.  HENT:     Head: Normocephalic and atraumatic.  Eyes:     General: Vision grossly intact. Gaze aligned appropriately.     Pupils: Pupils are equal, round, and reactive to light.  Neck:     Trachea: Trachea and phonation normal.  Cardiovascular:     Rate and Rhythm: Normal rate and regular rhythm.  Pulses:          Dorsalis pedis pulses are 2+ on the right side and 2+ on the left side.  Pulmonary:     Effort: Pulmonary effort is normal. No respiratory distress.  Abdominal:     General: There is no distension.     Palpations: Abdomen is soft.     Tenderness: There is no abdominal tenderness. There is no guarding or rebound.  Musculoskeletal:        General: Normal range of motion.     Cervical back: Normal range of motion.       Feet:  Feet:     Right foot:     Protective Sensation: 5 sites tested.  5 sites sensed.     Left foot:     Protective Sensation: 5 sites tested.  5 sites sensed.     Comments: Superficial 1 cm abrasion at the base of the third and fourth toes on the dorsal aspect.  No  foreign bodies.  No surrounding erythema.  No induration bleeding or drainage.  Mildly tender along the metatarsals.  No TTP of the Achilles tendon, anterior ankle, medial/lateral malleolus, tarsals or toes.  No TTP of the lower leg or knee.  Strong equal pedal pulses.  Capillary refill and sensation intact to all toes. Skin:    General: Skin is warm and dry.  Neurological:     Mental Status: He is alert.     GCS: GCS eye subscore is 4. GCS verbal subscore is 5. GCS motor subscore is 6.     Comments: Speech is clear and goal oriented, follows commands Major Cranial nerves without deficit, no facial droop Moves extremities without ataxia, coordination intact  Psychiatric:        Behavior: Behavior normal.    ED Results / Procedures / Treatments   Labs (all labs ordered are listed, but only abnormal results are displayed) Labs Reviewed - No data to display  EKG None  Radiology DG Foot Complete Right  Result Date: 02/14/2021 CLINICAL DATA:  Pain in the midfoot and metatarsal region. 50 pound weight dropped on foot. EXAM: RIGHT FOOT COMPLETE - 3+ VIEW COMPARISON:  None. FINDINGS: Dorsal soft tissue swelling in the forefoot. No evidence of underlying fracture or dislocation. IMPRESSION: Dorsal soft tissue swelling without bone or joint abnormality. Electronically Signed   By: Paulina Fusi M.D.   On: 02/14/2021 08:01    Procedures Procedures   Medications Ordered in ED Medications  oxyCODONE-acetaminophen (PERCOCET/ROXICET) 5-325 MG per tablet 1 tablet (1 tablet Oral Given 02/14/21 0743)  bacitracin ointment ( Topical Given 02/14/21 1156)    ED Course  I have reviewed the triage vital signs and the nursing notes.  Pertinent labs & imaging results that were available during my care of the patient were reviewed by me and considered in my medical decision making (see chart for details).    MDM Rules/Calculators/A&P                          Additional history obtained from: Nursing  notes from this visit. ---------------------- 35 year old male presented for right foot injury, dropped a 50 pound dumbbell on his foot earlier today.  On exam he is a small abrasion at the base of the third and fourth toes without foreign body.  No evidence for an open fracture.  X-ray was obtained in triage which is negative for fracture dislocation does show some soft tissue swelling.  He  is neurovascular intact distally sensation and cap refill is intact to all toes.  Strong equal pedal pulses.  There is no evidence for cellulitis, septic arthritis, DVT, compartment syndrome, neurovascular, is, open fracture or other emergent pathologies at this time.  Offered patient a tetanus shot today he declined he is unsure if his tetanus is up-to-date I encouraged him strongly to speak with his primary care provider this week to see if he needs a updated tetanus shot.  There is no indication for oral antibiotics at this time.  I advised patient use a small mount of antibiotic ointment on the superficial abrasion for the next few days to help avoid infection and to return to the ER for signs of infection occur.  I gave the patient referral to on-call orthopedist Dr. Yevette Edwards for reevaluation of his foot pain, patient is aware that follow-up x-rays may be needed to assess for occult fracture or other injuries.  Patient was provided with a cam boot today and crutches, advised nonweightbearing, RICE therapy and OTC anti-inflammatories.  I personally reviewed patient's x-rays today and agree with radiologist findings, no acute fracture or dislocation identified  Patient was also advised to avoid driving today as he received Percocet in triage.   At this time there does not appear to be any evidence of an acute emergency medical condition and the patient appears stable for discharge with appropriate outpatient follow up. Diagnosis was discussed with patient who verbalizes understanding of care plan and is agreeable to  discharge. I have discussed return precautions with patient who verbalizes understanding. Patient encouraged to follow-up with their PCP. All questions answered.   Note: Portions of this report may have been transcribed using voice recognition software. Every effort was made to ensure accuracy; however, inadvertent computerized transcription errors may still be present.  Final Clinical Impression(s) / ED Diagnoses Final diagnoses:  Injury of right foot, initial encounter    Rx / DC Orders ED Discharge Orders     None        Elizabeth Palau 02/14/21 1237    Pricilla Loveless, MD 02/14/21 [phone]

## 2021-02-14 NOTE — ED Notes (Signed)
Pt wound cleaned

## 2021-02-14 NOTE — Progress Notes (Signed)
Orthopedic Tech Progress Note Patient Details:  Thomas Tucker [birth year] [id number]  Ortho Devices Type of Ortho Device: CAM walker, Crutches Ortho Device/Splint Location: RLE Ortho Device/Splint Interventions: Ordered, Application, Adjustment   Post Interventions Patient Tolerated: Well, Ambulated well Instructions Provided: Care of device  [name] 02/14/2021, 12:02 PM

## 2021-02-14 NOTE — Discharge Instructions (Addendum)
At this time there does not appear to be the presence of an emergent medical condition, however there is always the potential for conditions to change. Please read and follow the below instructions.  Please return to the Emergency Department immediately for any new or worsening symptoms. Please be sure to follow up with your Primary Care Provider within one week regarding your visit today; please call their office to schedule an appointment even if you are feeling better for a follow-up visit. Please speak with your primary care provider about your tetanus status and to see if you need an updated tetanus shot. Please call the orthopedic specialist Dr. Yevette Edwards under discharge paperwork to schedule follow-up appointment for further evaluation treatment of your foot pain.  Please use the cam boot and crutches to keep weight off of your foot and avoid further injury.  Please use rest ice and elevation help with pain and swelling.  You may need another x-ray on your foot to assess for unseen injury such as fracture or concerns. Please monitor the small abrasion on your foot for signs of infection.  Please apply small amount of antibiotic ointment to the area twice a day for the next few days.  If you  develop any signs infection such as redness, swelling, pain, drainage please return to the ER immediately for reevaluation  Go to the nearest Emergency Department immediately if: You have fever or chills Your foot is numb or tingling. Your foot or toes are swollen. Your foot or toes turn white or blue. You have warmth and redness along your foot. You have any new/concerning or worsening of symptoms.    Please read the additional information packets attached to your discharge summary.  Do not take your medicine if  develop an itchy rash, swelling in your mouth or lips, or difficulty breathing; call 911 and seek immediate emergency medical attention if this occurs.  You may review your lab tests and  imaging results in their entirety on your MyChart account.  Please discuss all results of fully with your primary care provider and other specialist at your follow-up visit.  Note: Portions of this text may have been transcribed using voice recognition software. Every effort was made to ensure accuracy; however, inadvertent computerized transcription errors may still be present.

## 2021-02-14 NOTE — ED Notes (Signed)
Patient verbalizes understanding of discharge instructions. Opportunity for questioning and answers were provided. Armband removed by staff, pt discharged from ED and ambulated to vehicle to return home.   

## 2021-03-01 ENCOUNTER — Emergency Department (HOSPITAL_COMMUNITY)
Admission: EM | Admit: 2021-03-01 | Discharge: 2021-03-01 | Disposition: A | Discharge status: Home / Self Care | Payer: 59 | Attending: Emergency Medicine | Admitting: Emergency Medicine

## 2021-03-01 ENCOUNTER — Emergency Department (HOSPITAL_COMMUNITY): Payer: 59

## 2021-03-01 ENCOUNTER — Encounter (HOSPITAL_COMMUNITY): Payer: Self-pay

## 2021-03-01 ENCOUNTER — Encounter: Payer: Self-pay | Admitting: Plastic Surgery

## 2021-03-01 ENCOUNTER — Other Ambulatory Visit: Payer: Self-pay

## 2021-03-01 DIAGNOSIS — S61301A Unspecified open wound of left index finger with damage to nail, initial encounter: Secondary | ICD-10-CM | POA: Diagnosis not present

## 2021-03-01 DIAGNOSIS — S61305A Unspecified open wound of left ring finger with damage to nail, initial encounter: Secondary | ICD-10-CM | POA: Diagnosis not present

## 2021-03-01 DIAGNOSIS — S62633A Displaced fracture of distal phalanx of left middle finger, initial encounter for closed fracture: Secondary | ICD-10-CM | POA: Insufficient documentation

## 2021-03-01 DIAGNOSIS — Z87891 Personal history of nicotine dependence: Secondary | ICD-10-CM | POA: Insufficient documentation

## 2021-03-01 DIAGNOSIS — S6992XA Unspecified injury of left wrist, hand and finger(s), initial encounter: Secondary | ICD-10-CM

## 2021-03-01 DIAGNOSIS — Z23 Encounter for immunization: Secondary | ICD-10-CM | POA: Insufficient documentation

## 2021-03-01 DIAGNOSIS — Z20822 Contact with and (suspected) exposure to covid-19: Secondary | ICD-10-CM | POA: Insufficient documentation

## 2021-03-01 DIAGNOSIS — W230XXA Caught, crushed, jammed, or pinched between moving objects, initial encounter: Secondary | ICD-10-CM | POA: Insufficient documentation

## 2021-03-01 LAB — RESP PANEL BY RT-PCR (FLU A&B, COVID) ARPGX2
Influenza A by PCR: NEGATIVE
Influenza B by PCR: NEGATIVE
SARS Coronavirus 2 by RT PCR: NEGATIVE

## 2021-03-01 MED ORDER — OXYCODONE HCL 5 MG PO TABS
5.0000 mg | ORAL_TABLET | Freq: Once | ORAL | Status: AC
  Administered 2021-03-01: 5 mg via ORAL
  Filled 2021-03-01: qty 1

## 2021-03-01 MED ORDER — CEFAZOLIN SODIUM-DEXTROSE 1-4 GM/50ML-% IV SOLN
1.0000 g | Freq: Once | INTRAVENOUS | Status: AC
Start: 2021-03-01 — End: 2021-03-01
  Administered 2021-03-01: 1 g via INTRAVENOUS
  Filled 2021-03-01: qty 50

## 2021-03-01 MED ORDER — CEPHALEXIN 500 MG PO CAPS: 500.0000 mg | ORAL_CAPSULE | Freq: Four times a day (QID) | ORAL | 0 refills | Status: AC

## 2021-03-01 MED ORDER — TETANUS-DIPHTH-ACELL PERTUSSIS 5-2.5-18.5 LF-MCG/0.5 IM SUSY
0.5000 mL | PREFILLED_SYRINGE | Freq: Once | INTRAMUSCULAR | Status: AC
  Administered 2021-03-01: 0.5 mL via INTRAMUSCULAR
  Filled 2021-03-01: qty 0.5

## 2021-03-01 MED ORDER — LIDOCAINE HCL (PF) 1 % IJ SOLN
10.0000 mL | Freq: Once | INTRAMUSCULAR | Status: AC
  Administered 2021-03-01: 10 mL
  Filled 2021-03-01: qty 10

## 2021-03-01 NOTE — Progress Notes (Signed)
Orthopedic Tech Progress Note Patient Details:  Thomas Tucker 1986-05-22 [id number]  Ortho Devices Type of Ortho Device: Finger splint Ortho Device/Splint Location: LUE Ortho Device/Splint Interventions: Ordered, Application, Adjustment   Post Interventions Patient Tolerated: Well Instructions Provided: Adjustment of device, Care of device, Poper ambulation with device  [name] 03/01/2021, 8:03 AM

## 2021-03-01 NOTE — ED Provider Notes (Signed)
[facility] EMERGENCY DEPARTMENT Provider Note   CSN: [id number] Arrival date & time: 03/01/21  0127     History Chief Complaint  Patient presents with   left hand injury    Paul Trettin Schemm is a 35 y.o. male who presents with left hand injury after hand got caught in grinder. He has open wounds on left 2nd, 3rd, and 4th fingers. Fingers were wrapped loosely, bleeding controlled. Pt unsure of last tetanus. No other complaints.   HPI      Past Medical History:  Diagnosis Date   Myocardial infarction (HCC)     There are no problems to display for this patient.   History reviewed. No pertinent surgical history.     Family History  Problem Relation Age of Onset   Healthy Mother    Healthy Father     Social History   Tobacco Use   Smoking status: Former   Smokeless tobacco: Never  Substance Use Topics   Alcohol use: Yes   Drug use: Yes    Types: Marijuana    Home Medications Prior to Admission medications   Medication Sig Start Date End Date Taking? Authorizing Provider  cephALEXin (KEFLEX) 500 MG capsule Take 1 capsule (500 mg total) by mouth 4 (four) times daily for 7 days. 03/01/21 03/08/21 Yes Antony Madura, PA-C  ibuprofen (ADVIL) 800 MG tablet Take 1 tablet (800 mg total) by mouth 3 (three) times daily. 11/21/19   Elpidio Anis, PA-C  methocarbamol (ROBAXIN) 500 MG tablet Take 1 tablet (500 mg total) by mouth 2 (two) times daily. 08/17/19   Gailen Shelter, PA  tiZANidine (ZANAFLEX) 4 MG tablet Take 1 tablet (4 mg total) by mouth every 6 (six) hours as needed for muscle spasms. 05/28/18   Eustace Moore, MD  triamcinolone (KENALOG) 0.1 % paste Use as directed 1 application in the mouth or throat 2 (two) times daily. 06/23/19   Wieters, Hallie C, PA-C  triamcinolone cream (KENALOG) 0.1 % Apply 1 application topically 2 (two) times daily. 03/24/18   Elvina Sidle, MD    Allergies    Patient has no known allergies.  Review of Systems    Review of Systems  Constitutional:  Negative for chills and fever.  Respiratory:  Negative for shortness of breath.   Cardiovascular:  Negative for chest pain.  Skin:  Positive for wound. Negative for color change and rash.       Wounds to left 2nd, 3rd, and 4th fingers  All other systems reviewed and are negative.  Physical Exam Updated Vital Signs BP (!) 141/89   Pulse (!) 59   Temp 98.1 F (36.7 C) (Oral)   Resp 18   SpO2 100%   Physical Exam Vitals and nursing note reviewed.  Constitutional:      Appearance: Normal appearance.  HENT:     Head: Normocephalic and atraumatic.  Eyes:     Conjunctiva/sclera: Conjunctivae normal.  Pulmonary:     Effort: Pulmonary effort is normal. No respiratory distress.  Skin:    General: Skin is warm and dry.     Comments: Deep avulsions to left 2nd, 3rd, and 4th fingers of left hand. Avulsions extend into nail beds of affected digits. Open fracture involving the third distal phalanx. Germinal matrices appear intact. No palpable pulsatile bleeding. Sensation intact. Capillary refill otherwise normal. Full ROM of all fingers.   Neurological:     Mental Status: He is alert.  Psychiatric:  Mood and Affect: Mood normal.        Behavior: Behavior normal.       ED Results / Procedures / Treatments   Labs (all labs ordered are listed, but only abnormal results are displayed) Labs Reviewed  RESP PANEL BY RT-PCR (FLU A&B, COVID) [id number]    EKG None  Radiology DG Hand Complete Left  Result Date: 03/01/2021 CLINICAL DATA:  Caught hand in grinder with soft tissue injury, initial encounter EXAM: LEFT HAND - COMPLETE 3+ VIEW COMPARISON:  None. FINDINGS: Distal phalangeal tuft fracture is noted involving the digit. Some associated soft tissue irregularity is noted. No other fracture is seen. IMPRESSION: Phalangeal tuft fracture involving the third distal phalanx. Some associated soft tissue changes are noted. No other fracture is seen.  Electronically Signed   By: Alcide Clever M.D.   On: 03/01/2021 02:01    Procedures Procedures   Medications Ordered in ED Medications  ceFAZolin (ANCEF) IVPB 1 g/50 mL premix (0 g Intravenous Stopped 03/01/21 0518)  Tdap (BOOSTRIX) injection 0.5 mL (0.5 mLs Intramuscular Given 03/01/21 0452)  oxyCODONE (Oxy IR/ROXICODONE) immediate release tablet 5 mg (5 mg Oral Given 03/01/21 0529)  lidocaine (PF) (XYLOCAINE) 1 % injection 10 mL (10 mLs Infiltration Given by Other 03/01/21 9450)    ED Course  I have reviewed the triage vital signs and the nursing notes.  Pertinent labs & imaging results that were available during my care of the patient were reviewed by me and considered in my medical decision making (see chart for details).  Clinical Course as of 03/01/21 0706  Thu Mar 01, 2021  0600 Spoke with Dr. Arita Miss on call for hand surgery. Will follow patient in the office. [KH]    Clinical Course User Index [KH] Antony Madura, PA-C   MDM Rules/Calculators/A&P                           Patient is 35 y/o male who presents after left hand got caught in grinder, with damage to 2nd, 3rd and 4th fingers of left hand. On exam, deep avulsions to left 2nd, 3rd, and 4th fingers of left hand extending to nail beds. Clinically open fracture involving the third distal phalanx. Germinal matrices appear intact. No palpable pulsatile bleeding. Sensation intact. Capillary refill otherwise normal. Full ROM of all fingers.  XR shows phalangeal tuft fracture involving the third distal phalanx. Patient given empiric antibiotics, tetanus. Digital block achieved with 1% lidocaine for pressure irrigation. Consulted Dr. Arita Miss with hand surgery, agrees with ED interventions, will follow up closely with patient in his office. Patient can discharge to home with prescription for cephalexin. Patient agreeable to plan.  Final Clinical Impression(s) / ED Diagnoses Final diagnoses:  Hand injury, left, initial encounter    Rx  / DC Orders ED Discharge Orders          Ordered    cephALEXin (KEFLEX) 500 MG capsule  4 times daily        03/01/21 0703             Myrel Rappleye T, PA-C 03/01/21 0713    Glynn Octave, MD 03/01/21 [phone]

## 2021-03-01 NOTE — Discharge Instructions (Addendum)
Your x-ray showed a fracture to your middle finger. We cleaned your wounds and dressed them. We contacted the hand surgeon and he would like you to follow up in his clinic. I've attached his contact information for him. Call and make appointment within the week.

## 2021-03-01 NOTE — ED Triage Notes (Signed)
Pt was at work and left hand was caught in a grinder. Pt has open areas on left 2nd, 3rd, 4th fingers. Bleeding controlled, fingers wrapped loosely.  Pt unable to remember last tetanus.

## 2021-03-15 ENCOUNTER — Other Ambulatory Visit: Payer: Self-pay

## 2021-03-15 ENCOUNTER — Encounter: Payer: Self-pay | Admitting: Plastic Surgery

## 2021-03-15 ENCOUNTER — Ambulatory Visit (INDEPENDENT_AMBULATORY_CARE_PROVIDER_SITE_OTHER): Payer: 59 | Admitting: Plastic Surgery

## 2021-03-15 VITALS — BP 129/77 | HR 71 | Ht 70.0 in | Wt 148.8 lb

## 2021-03-15 DIAGNOSIS — S6992XA Unspecified injury of left wrist, hand and finger(s), initial encounter: Secondary | ICD-10-CM | POA: Diagnosis not present

## 2021-03-15 NOTE — Progress Notes (Signed)
   Referring Provider No referring provider defined for this encounter.   CC:  Chief Complaint  Patient presents with   Advice Only      Thomas Tucker is an 35 y.o. male.  HPI: Patient presents as referral from the emergency room for evaluation of a left hand injury.  This occurred through a grinding mechanism at work.  He had superficial wounds to the dorsal aspect of his index long and ring fingers.  These have been treated with wound care.  He feels like things are going well and he would like to go back to work.  No Known Allergies  Outpatient Encounter Medications as of 03/15/2021  Medication Sig   ibuprofen (ADVIL) 800 MG tablet Take 1 tablet (800 mg total) by mouth 3 (three) times daily. (Patient not taking: Reported on 03/15/2021)   methocarbamol (ROBAXIN) 500 MG tablet Take 1 tablet (500 mg total) by mouth 2 (two) times daily. (Patient not taking: Reported on 03/15/2021)   tiZANidine (ZANAFLEX) 4 MG tablet Take 1 tablet (4 mg total) by mouth every 6 (six) hours as needed for muscle spasms. (Patient not taking: Reported on 03/15/2021)   triamcinolone (KENALOG) 0.1 % paste Use as directed 1 application in the mouth or throat 2 (two) times daily. (Patient not taking: Reported on 03/15/2021)   triamcinolone cream (KENALOG) 0.1 % Apply 1 application topically 2 (two) times daily. (Patient not taking: Reported on 03/15/2021)   No facility-administered encounter medications on file as of 03/15/2021.     Past Medical History:  Diagnosis Date   Myocardial infarction (HCC)     No past surgical history on file.  Family History  Problem Relation Age of Onset   Healthy Mother    Healthy Father     Social History   Social History Narrative   Not on file     Review of Systems General: Denies fevers, chills, weight loss CV: Denies chest pain, shortness of breath, palpitations  Physical Exam Vitals with BMI 03/15/2021 03/01/2021 03/01/2021  Height 5\' 10"  - -  Weight 148 lbs 13 oz - -   BMI 21.35 - -  Systolic 129 154  Diastolic 77 98 103  Pulse 71 60 56    General:  No acute distress,  Alert and oriented, Non-Toxic, Normal speech and affect Left hand: Fingers well-perfused normal cap refill to palp radial pulse.  Sensation is intact throughout.  He has near full range of motion.  He has some restriction to flexion at the DIP joints of the index long and ring fingers due to some early scarring in those areas.  His wounds have totally epithelialized.  No signs of infection.  Assessment/Plan Patient presents with healed epithelialized wound to the dorsal aspect of his index long and ring fingers after a traumatic injury.  I recommended moisturizer to the scars to help keep them soft.  I brought up going to physical therapy to improve the DIP range of motion but he feels that he can work through this and would rather not go to physical therapy at this time.  It is important to him to return to work and I think it is reasonable for him to do so whenever he would like.  I will plan to see him again in 6 weeks to check his progress.  All of his questions were answered.  937 03/15/2021, 3:47 PM

## 2021-04-26 ENCOUNTER — Ambulatory Visit: Payer: 59 | Admitting: Surgical

## 2021-06-30 ENCOUNTER — Encounter (HOSPITAL_COMMUNITY): Payer: Self-pay

## 2021-06-30 ENCOUNTER — Ambulatory Visit (HOSPITAL_COMMUNITY)
Admission: EM | Admit: 2021-06-30 | Discharge: 2021-06-30 | Disposition: A | Discharge status: Home / Self Care | Payer: 59 | Attending: Physician Assistant | Admitting: Physician Assistant

## 2021-06-30 ENCOUNTER — Ambulatory Visit (INDEPENDENT_AMBULATORY_CARE_PROVIDER_SITE_OTHER): Payer: 59

## 2021-06-30 ENCOUNTER — Ambulatory Visit (HOSPITAL_COMMUNITY): Payer: 59

## 2021-06-30 DIAGNOSIS — S6991XA Unspecified injury of right wrist, hand and finger(s), initial encounter: Secondary | ICD-10-CM | POA: Diagnosis not present

## 2021-06-30 DIAGNOSIS — M79644 Pain in right finger(s): Secondary | ICD-10-CM

## 2021-06-30 MED ORDER — DOXYCYCLINE HYCLATE 100 MG PO CAPS: 100.0000 mg | ORAL_CAPSULE | Freq: Two times a day (BID) | ORAL | 0 refills | Status: DC

## 2021-06-30 NOTE — ED Triage Notes (Signed)
Pt c/o injury to right middle finger about 1 month ago. Did not seek care at that time. There appears to be a scar about 2.5 in in length that pt states was opened and healed on its own. The knuckle appears edematous and erythematous. States it does not hurt on palpation but if he bumps it against an object it hurts.

## 2021-06-30 NOTE — ED Provider Notes (Signed)
[facility]    CSN: [id number] Arrival date & time: 06/30/21  1417      History   Chief Complaint Chief Complaint  Patient presents with   right middle finger injury    HPI Thomas Tucker is a 35 y.o. male.   Patient here today for evaluation of right middle finger injury that occurred about a month ago.  He reports that initially he treated at home with no significant swelling or concerns.  2 days ago he started to have more swelling and pain in the area.  He denies any further injury but does state that he hits his finger at work at times and this is painful.  He has not had any fever.  The history is provided by the patient.   Past Medical History:  Diagnosis Date   Myocardial infarction (HCC)     There are no problems to display for this patient.   History reviewed. No pertinent surgical history.     Home Medications    Prior to Admission medications   Medication Sig Start Date End Date Taking? Authorizing Provider  doxycycline (VIBRAMYCIN) 100 MG capsule Take 1 capsule (100 mg total) by mouth 2 (two) times daily. 06/30/21  Yes Tomi Bamberger, PA-C  ibuprofen (ADVIL) 800 MG tablet Take 1 tablet (800 mg total) by mouth 3 (three) times daily. Patient not taking: Reported on 03/15/2021 11/21/19   Elpidio Anis, PA-C  methocarbamol (ROBAXIN) 500 MG tablet Take 1 tablet (500 mg total) by mouth 2 (two) times daily. Patient not taking: Reported on 03/15/2021 08/17/19   Gailen Shelter, PA  tiZANidine (ZANAFLEX) 4 MG tablet Take 1 tablet (4 mg total) by mouth every 6 (six) hours as needed for muscle spasms. Patient not taking: Reported on 03/15/2021 05/28/18   Eustace Moore, MD  triamcinolone (KENALOG) 0.1 % paste Use as directed 1 application in the mouth or throat 2 (two) times daily. Patient not taking: Reported on 03/15/2021 06/23/19   Wieters, Hallie C, PA-C  triamcinolone cream (KENALOG) 0.1 % Apply 1 application topically 2 (two) times daily. Patient  not taking: Reported on 03/15/2021 03/24/18   Elvina Sidle, MD    Family History Family History  Problem Relation Age of Onset   Healthy Mother    Healthy Father     Social History Social History   Tobacco Use   Smoking status: Former   Smokeless tobacco: Never  Substance Use Topics   Alcohol use: Yes   Drug use: Yes    Types: Marijuana     Allergies   Patient has no known allergies.   Review of Systems Review of Systems  Constitutional:  Negative for chills and fever.  Eyes:  Negative for discharge and redness.  Respiratory:  Negative for shortness of breath.   Musculoskeletal:  Positive for arthralgias. Negative for joint swelling.  Skin:  Positive for color change and wound.  Neurological:  Negative for numbness.    Physical Exam Triage Vital Signs ED Triage Vitals [06/30/21 1501]  Enc Vitals Group     BP (!) 157/85     Pulse Rate 65     Resp 18     Temp 98.1 F (36.7 C)     Temp Source Oral     SpO2 98 %     Weight      Height      Head Circumference      Peak Flow      Pain Score 0  Pain Loc      Pain Edu?      Excl. in GC?    No data found.  Updated Vital Signs BP (!) 157/85 (BP Location: Left Arm)    Pulse 65    Temp 98.1 F (36.7 C) (Oral)    Resp 18    SpO2 98%   Physical Exam Vitals and nursing note reviewed.  Constitutional:      General: He is not in acute distress.    Appearance: Normal appearance. He is not ill-appearing.  HENT:     Head: Normocephalic and atraumatic.  Eyes:     Conjunctiva/sclera: Conjunctivae normal.  Cardiovascular:     Rate and Rhythm: Normal rate.  Pulmonary:     Effort: Pulmonary effort is normal.  Musculoskeletal:     Comments: Swelling appreciated between right third DIP and PIP where healed scars also appreciated.  Mild erythema noted.  Some tenderness to palpation to area diffusely.  Neurological:     Mental Status: He is alert.  Psychiatric:        Mood and Affect: Mood normal.         Behavior: Behavior normal.        Thought Content: Thought content normal.     UC Treatments / Results  Labs (all labs ordered are listed, but only abnormal results are displayed) Labs Reviewed - No data to display  EKG   Radiology DG Finger Middle Right  Result Date: 06/30/2021 CLINICAL DATA:  Trauma 1 month ago. EXAM: RIGHT MIDDLE FINGER 2+V COMPARISON:  None. FINDINGS: Soft tissue swelling centered about the middle phalanx of the middle finger. No radiopaque foreign object. No osseous destruction. On the lateral view, subtle osseous irregularity involves the volar aspect of the proximal portion of the middle phalanx. No donor site identified. IMPRESSION: Soft tissue swelling. Subtle osseous irregularity involving the proximal portion of the middle phalanx of the third digit. Cannot exclude remote avulsion fracture. Electronically Signed   By: Jeronimo Greaves M.D.   On: 06/30/2021 15:20    Procedures Procedures (including critical care time)  Medications Ordered in UC Medications - No data to display  Initial Impression / Assessment and Plan / UC Course  I have reviewed the triage vital signs and the nursing notes.  Pertinent labs & imaging results that were available during my care of the patient were reviewed by me and considered in my medical decision making (see chart for details).  X-ray with soft tissue swelling with possible avulsion fracture.  Will treat with antibiotics to cover likely infection given acute swelling, and recommended follow-up with Ortho next week.  Patient expresses understanding. Encouraged sooner follow up if no gradual improvement.   Final Clinical Impressions(s) / UC Diagnoses   Final diagnoses:  Finger injury, right, initial encounter     Discharge Instructions      Take antibiotic as prescribed. Follow up with ortho early next week.     ED Prescriptions     Medication Sig Dispense Auth. Provider   doxycycline (VIBRAMYCIN) 100 MG capsule  Take 1 capsule (100 mg total) by mouth 2 (two) times daily. 20 capsule Tomi Bamberger, PA-C      PDMP not reviewed this encounter.   Tomi Bamberger, PA-C 06/30/21 1538

## 2021-06-30 NOTE — Discharge Instructions (Addendum)
Take antibiotic as prescribed. Follow up with ortho early next week.

## 2022-04-29 ENCOUNTER — Telehealth: Payer: 59 | Admitting: Family Medicine

## 2022-04-29 DIAGNOSIS — G43809 Other migraine, not intractable, without status migrainosus: Secondary | ICD-10-CM | POA: Diagnosis not present

## 2022-04-29 NOTE — Patient Instructions (Signed)
  Thomas Tucker, thank you for joining Perlie Mayo, NP for today's virtual visit.  While this provider is not your primary care provider (PCP), if your PCP is located in our provider database this encounter information will be shared with them immediately following your visit.   [facility] account gives you access to today's visit and all your visits, tests, and labs performed at [facility] " click here if you don't have a [facility] account or go to [email or web address]  Consent: (Patient) Thomas Brass Kimball provided verbal consent for this virtual visit at the beginning of the encounter.  Current Medications:  Current Outpatient Medications:    doxycycline (VIBRAMYCIN) 100 MG capsule, Take 1 capsule (100 mg total) by mouth 2 (two) times daily., Disp: 20 capsule, Rfl: 0   ibuprofen (ADVIL) 800 MG tablet, Take 1 tablet (800 mg total) by mouth 3 (three) times daily. (Patient not taking: Reported on 03/15/2021), Disp: 21 tablet, Rfl: 0   methocarbamol (ROBAXIN) 500 MG tablet, Take 1 tablet (500 mg total) by mouth 2 (two) times daily. (Patient not taking: Reported on 03/15/2021), Disp: 20 tablet, Rfl: 0   tiZANidine (ZANAFLEX) 4 MG tablet, Take 1 tablet (4 mg total) by mouth every 6 (six) hours as needed for muscle spasms. (Patient not taking: Reported on 03/15/2021), Disp: 30 tablet, Rfl: 0   triamcinolone (KENALOG) 0.1 % paste, Use as directed 1 application in the mouth or throat 2 (two) times daily. (Patient not taking: Reported on 03/15/2021), Disp: 5 g, Rfl: 0   triamcinolone cream (KENALOG) 0.1 %, Apply 1 application topically 2 (two) times daily. (Patient not taking: Reported on 03/15/2021), Disp: 80 g, Rfl: 1   Medications ordered in this encounter:  No orders of the defined types were placed in this encounter.    *If you need refills on other medications prior to your next appointment, please contact your pharmacy*  Follow-Up: Call back or seek an in-person  evaluation if the symptoms worsen or if the condition fails to improve as anticipated.  [place] [phone]  Other Instructions  Work not in [place]   If you have been instructed to have an in-person evaluation today at a local Urgent Care facility, please use the link below. It will take you to a list of all of our available [facility], including address, phone number and hours of operation. Please do not delay care.  [facility]  If you or a family member do not have a primary care provider, use the link below to schedule a visit and establish care. When you choose a [facility] primary care physician or advanced practice provider, you gain a long-term partner in health. Find a Primary Care Provider  Learn more about [facility] in-office and virtual care options: [facility]

## 2022-04-29 NOTE — Progress Notes (Signed)
Virtual Visit Consent   Thomas Tucker, you are scheduled for a virtual visit with a [facility] provider today. Just as with appointments in the office, your consent must be obtained to participate. Your consent will be active for this visit and any virtual visit you may have with one of our providers in the next 365 days. If you have a MyChart account, a copy of this consent can be sent to you electronically.  As this is a virtual visit, video technology does not allow for your provider to perform a traditional examination. This may limit your provider's ability to fully assess your condition. If your provider identifies any concerns that need to be evaluated in person or the need to arrange testing (such as labs, EKG, etc.), we will make arrangements to do so. Although advances in technology are sophisticated, we cannot ensure that it will always work on either your end or our end. If the connection with a video visit is poor, the visit may have to be switched to a telephone visit. With either a video or telephone visit, we are not always able to ensure that we have a secure connection.  By engaging in this virtual visit, you consent to the provision of healthcare and authorize for your insurance to be billed (if applicable) for the services provided during this visit. Depending on your insurance coverage, you may receive a charge related to this service.  I need to obtain your verbal consent now. Are you willing to proceed with your visit today? Consepcion Hearing Pink has provided verbal consent on 04/29/2022 for a virtual visit (video or telephone). [name], NP  Date: 04/29/2022 3:26 PM  Virtual Visit via Video Note   I, [name], connected with  Thomas Tucker  ([id number], [birth year]) on 04/29/22 at  3:30 PM EDT by a video-enabled telemedicine application and verified that I am speaking with the correct person using two identifiers.  Location: Patient: Virtual Visit Location Patient:  Home Provider: Virtual Visit Location Provider: Home Office   I discussed the limitations of evaluation and management by telemedicine and the availability of in person appointments. The patient expressed understanding and agreed to proceed.    History of Present Illness: Thomas Tucker is a 36 y.o. who identifies as a male who was assigned male at birth, and is being seen today for migraine.  HPI: Migraine  This is a new problem. The current episode started yesterday. The problem occurs constantly. The problem has been gradually worsening. The pain is located in the Right unilateral region. The pain does not radiate. The pain quality is similar to prior headaches. The quality of the pain is described as aching. Pain scale: 8/10 at worse, it is a 1/10 now. The pain is mild. Associated symptoms include phonophobia and photophobia. Pertinent negatives include no abdominal pain, abnormal behavior, anorexia, back pain, blurred vision, coughing, dizziness, drainage, ear pain, eye pain, eye redness, eye watering, facial sweating, fever, hearing loss, insomnia, loss of balance, muscle aches, nausea, neck pain, numbness, rhinorrhea, scalp tenderness, seizures, sinus pressure, sore throat, swollen glands, tingling, tinnitus, visual change, vomiting, weakness or weight loss. The symptoms are aggravated by bright light and noise. He has tried acetaminophen for the symptoms. The treatment provided significant relief. His past medical history is significant for migraine headaches.    Denies vision changes, weakness, or blood pressure issues.  Problems: There are no problems to display for this patient.   Allergies: No Known Allergies  Medications:  Current Outpatient Medications:    doxycycline (VIBRAMYCIN) 100 MG capsule, Take 1 capsule (100 mg total) by mouth 2 (two) times daily., Disp: 20 capsule, Rfl: 0   ibuprofen (ADVIL) 800 MG tablet, Take 1 tablet (800 mg total) by mouth 3 (three) times daily. (Patient  not taking: Reported on 03/15/2021), Disp: 21 tablet, Rfl: 0   methocarbamol (ROBAXIN) 500 MG tablet, Take 1 tablet (500 mg total) by mouth 2 (two) times daily. (Patient not taking: Reported on 03/15/2021), Disp: 20 tablet, Rfl: 0   tiZANidine (ZANAFLEX) 4 MG tablet, Take 1 tablet (4 mg total) by mouth every 6 (six) hours as needed for muscle spasms. (Patient not taking: Reported on 03/15/2021), Disp: 30 tablet, Rfl: 0   triamcinolone (KENALOG) 0.1 % paste, Use as directed 1 application in the mouth or throat 2 (two) times daily. (Patient not taking: Reported on 03/15/2021), Disp: 5 g, Rfl: 0   triamcinolone cream (KENALOG) 0.1 %, Apply 1 application topically 2 (two) times daily. (Patient not taking: Reported on 03/15/2021), Disp: 80 g, Rfl: 1  Observations/Objective: Patient is well-developed, well-nourished in no acute distress.  Resting comfortably at home.  Head is normocephalic, atraumatic.  No labored breathing.  Speech is clear and coherent with logical content.  Patient is alert and oriented at baseline.    Assessment and Plan:  1. Other migraine without status migrainosus, not intractable  -took tylenol and had improvement- needs work note from missing work last night. -denies other issues or complaints at this time    Follow Up Instructions: I discussed the assessment and treatment plan with the patient. The patient was provided an opportunity to ask questions and all were answered. The patient agreed with the plan and demonstrated an understanding of the instructions.  A copy of instructions were sent to the patient via MyChart unless otherwise noted below.     The patient was advised to call back or seek an in-person evaluation if the symptoms worsen or if the condition fails to improve as anticipated.  Time:  I spent 10 minutes with the patient via telehealth technology discussing the above problems/concerns.    Perlie Mayo, NP

## 2022-07-09 IMAGING — CR DG HAND COMPLETE 3+V*L*
3 series · 3 of 3 positions shown · non-contrast
Comparison: None.

CLINICAL DATA: Caught hand in grinder with soft tissue injury,
initial encounter

EXAM:
LEFT HAND - COMPLETE 3+ VIEW

[hand pa]
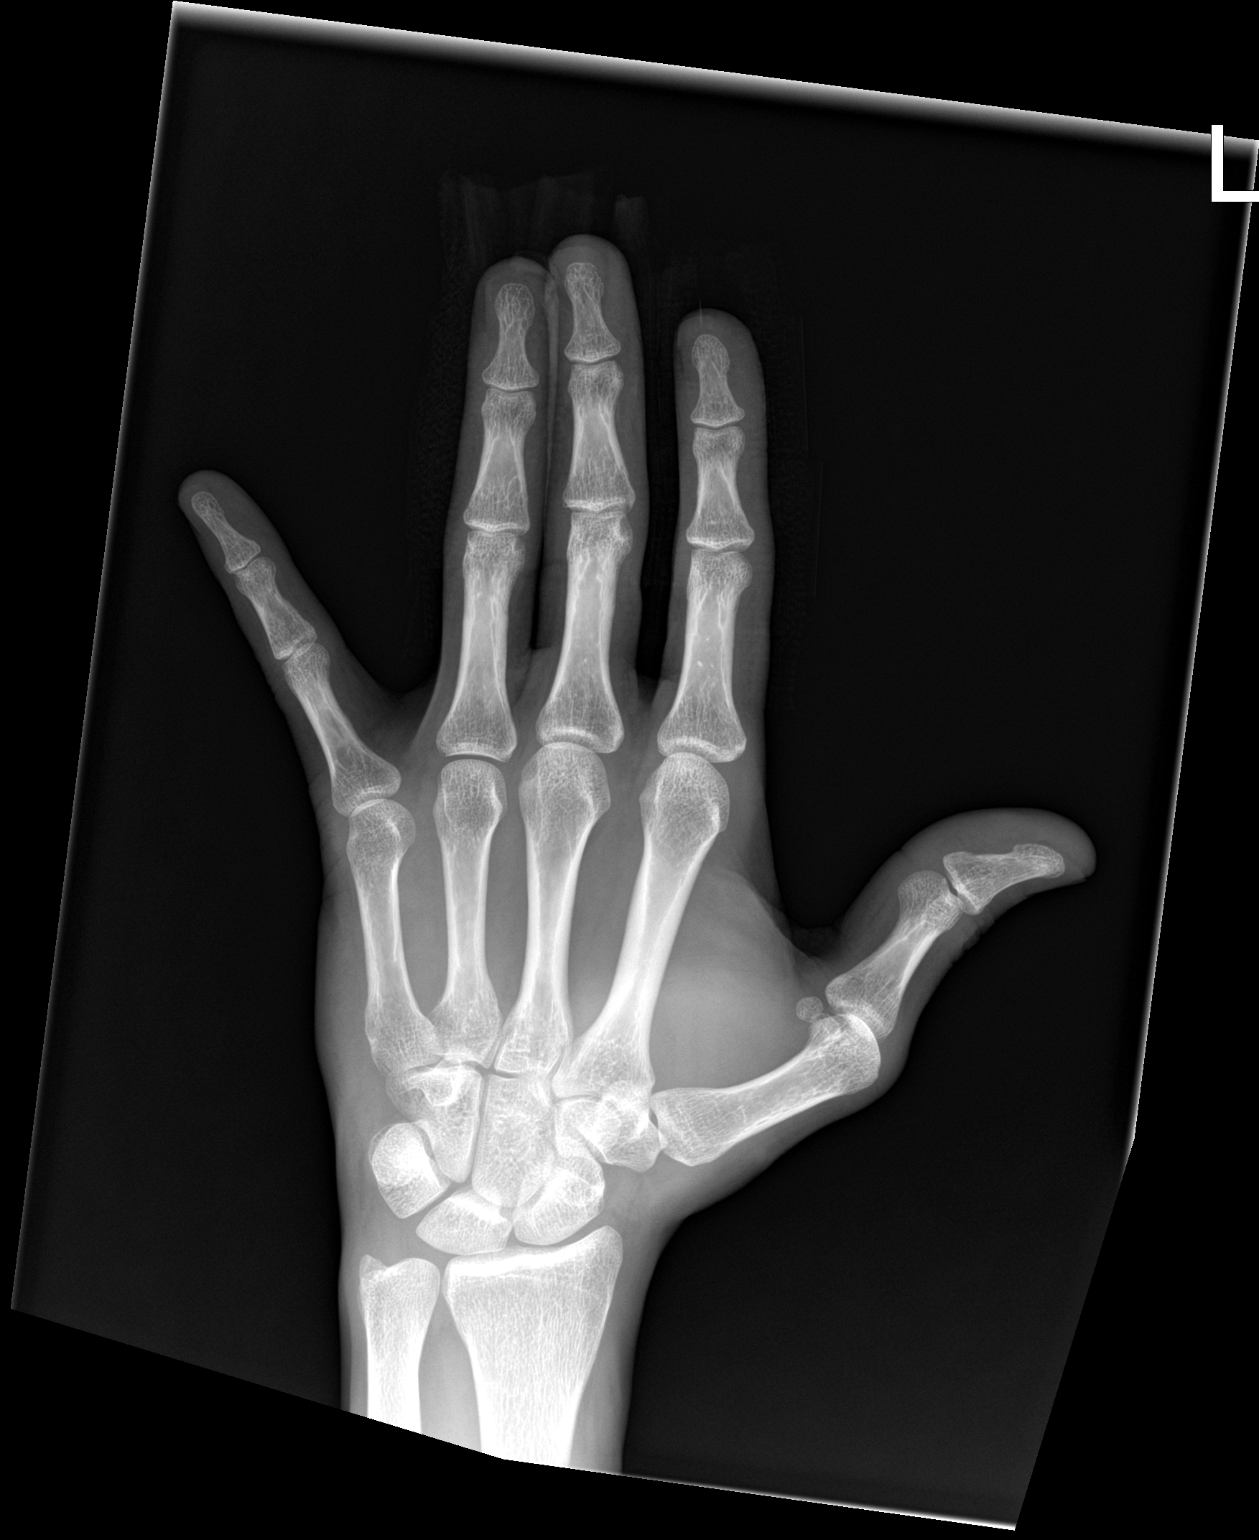

[hand obl]
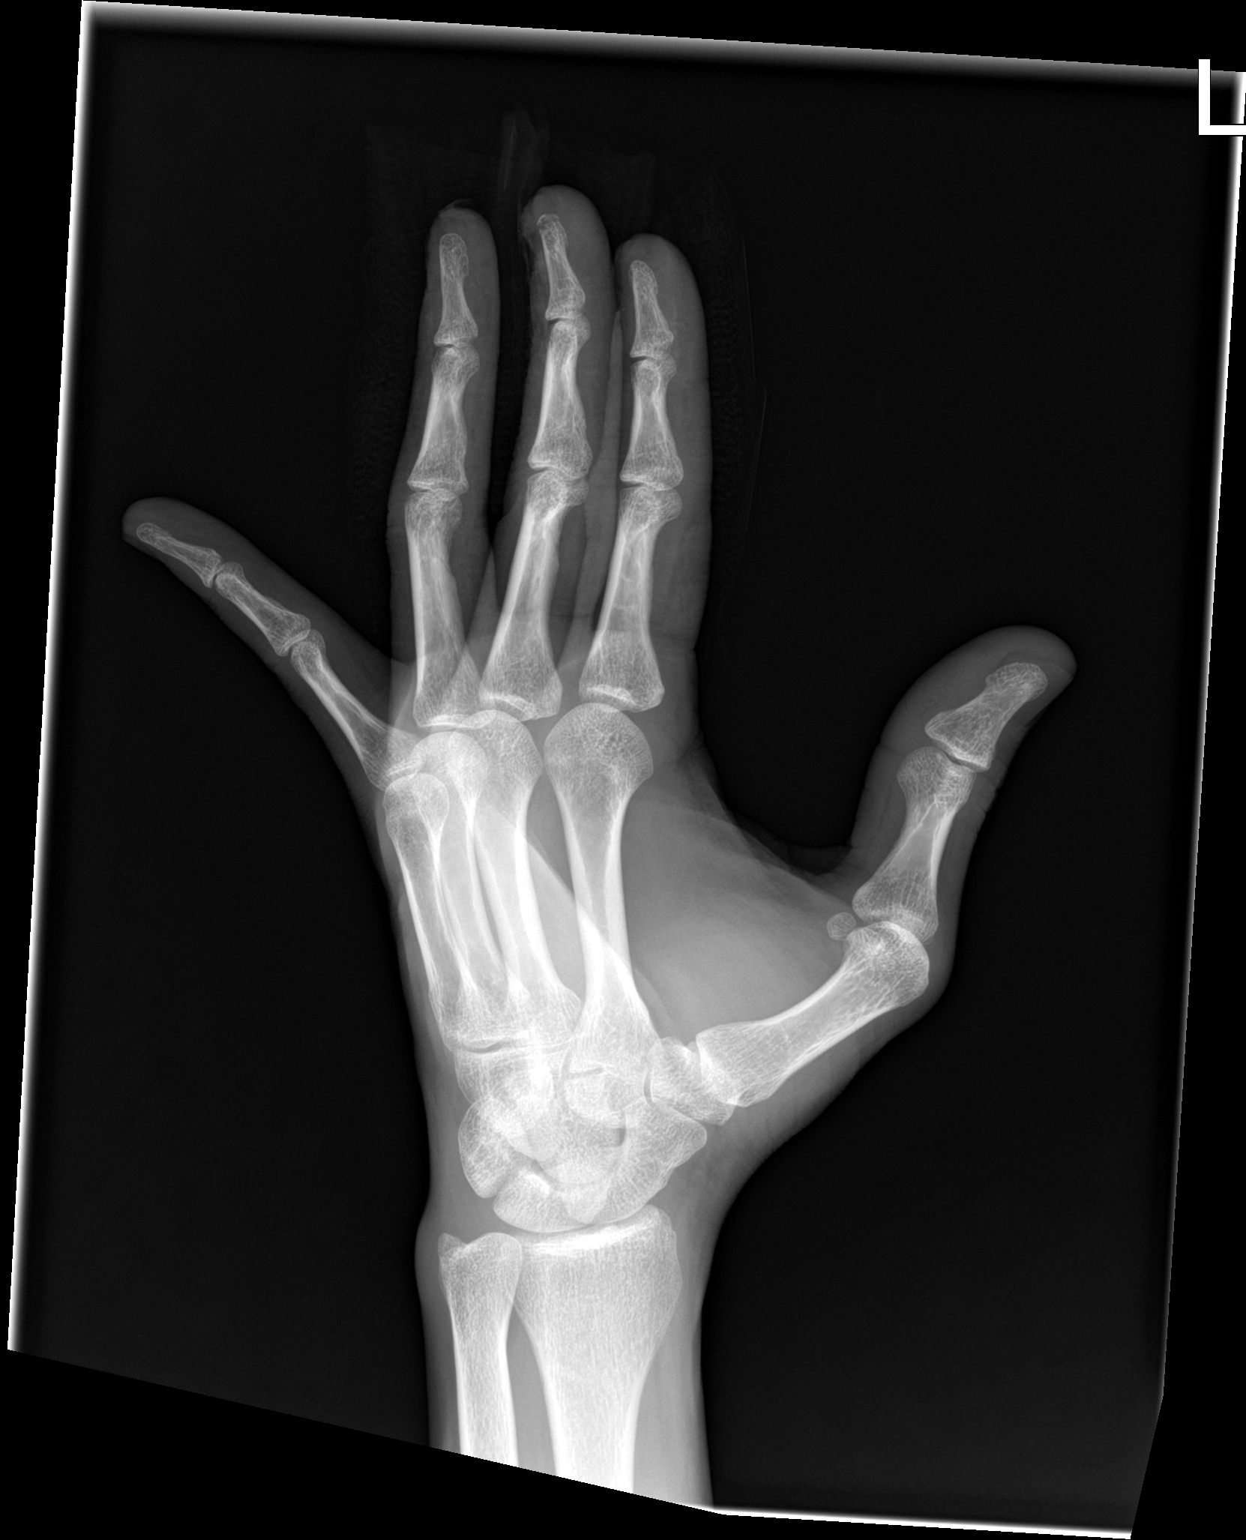

[hand lat]
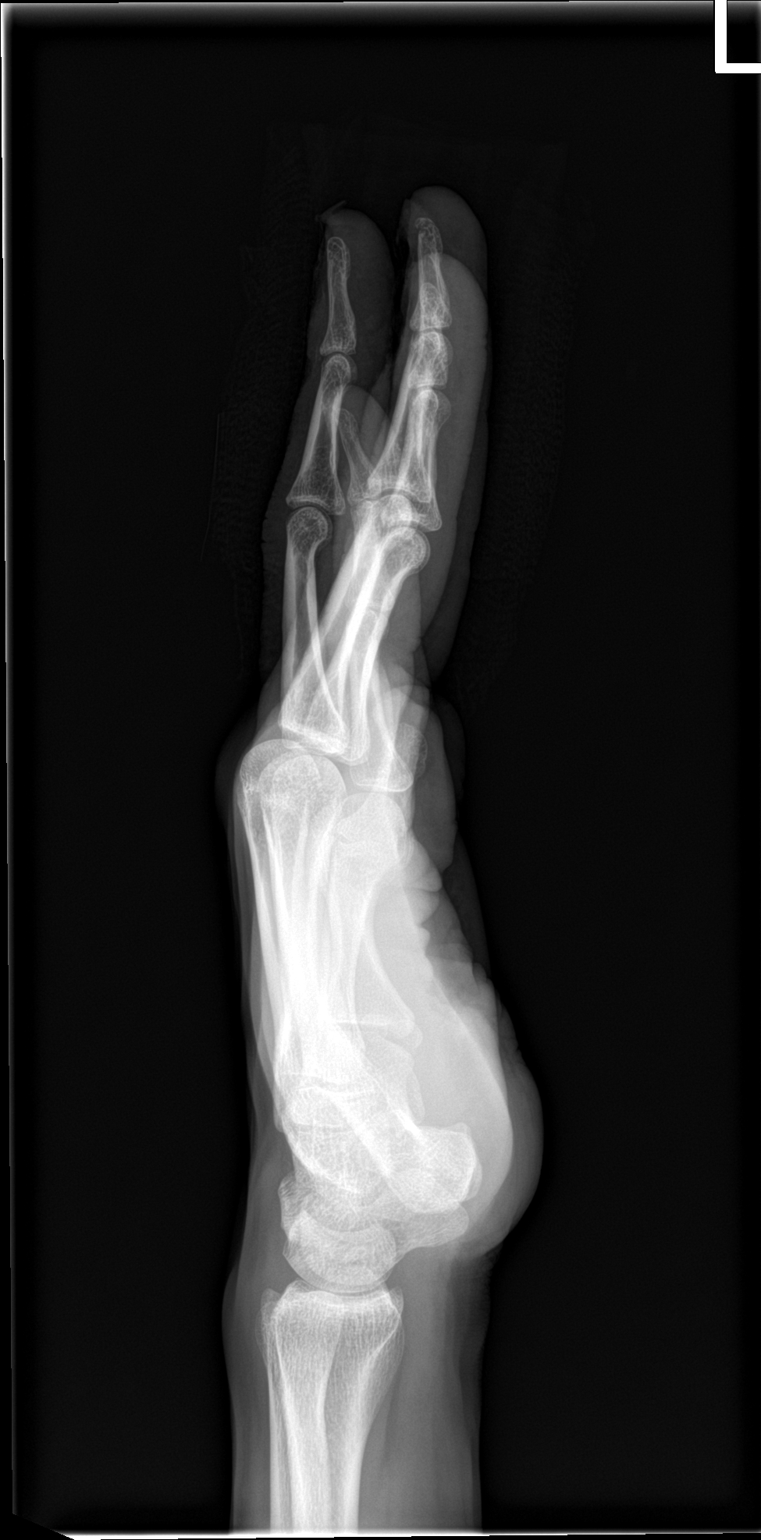

[3 of 3 positions shown; findings below may reference images not displayed]

FINDINGS: Distal phalangeal tuft fracture is noted involving the digit. Some
associated soft tissue irregularity is noted. No other fracture is
seen.
IMPRESSION: Phalangeal tuft fracture involving the third distal phalanx. Some
associated soft tissue changes are noted. No other fracture is seen.

## 2022-11-07 IMAGING — DX DG FINGER MIDDLE 2+V*R*
3 series · 3 of 3 positions shown · non-contrast
Comparison: None.

CLINICAL DATA: Trauma 1 month ago.

EXAM:
RIGHT MIDDLE FINGER 2+V

[finger ap]
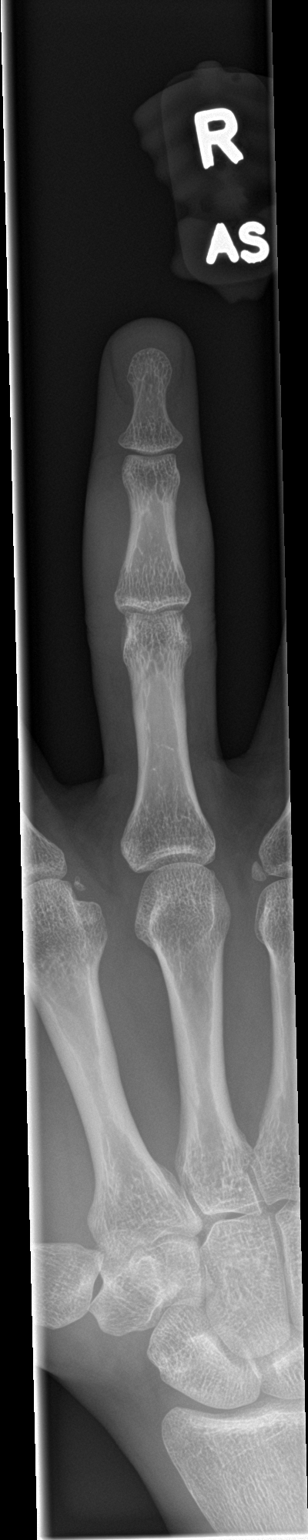

[finger obl]
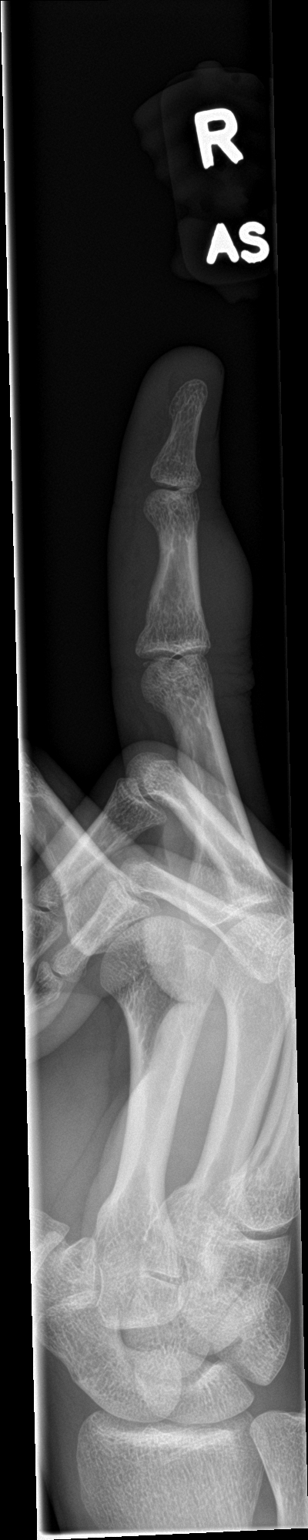

[finger lat]
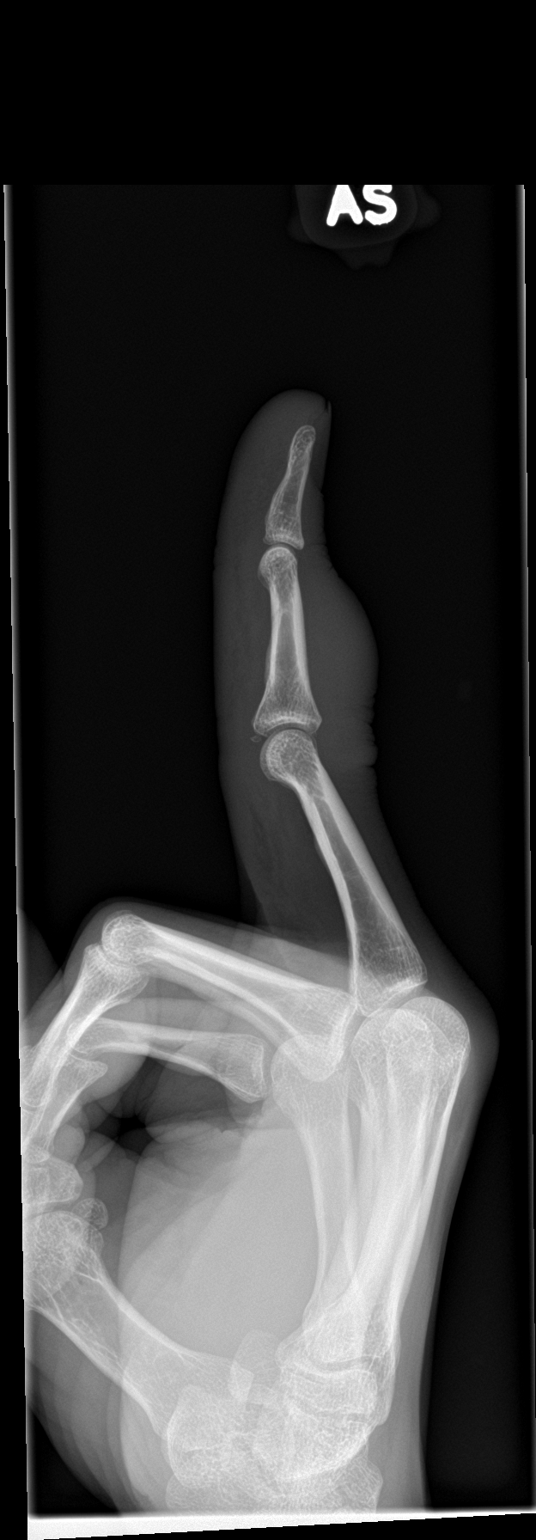

[3 of 3 positions shown; findings below may reference images not displayed]

FINDINGS: Soft tissue swelling centered about the middle phalanx of the middle
finger. No radiopaque foreign object. No osseous destruction. On the
lateral view, subtle osseous irregularity involves the volar aspect
of the proximal portion of the middle phalanx. No donor site
identified.
IMPRESSION: Soft tissue swelling. Subtle osseous irregularity involving the
proximal portion of the middle phalanx of the third digit. Cannot
exclude remote avulsion fracture.

## 2024-03-05 ENCOUNTER — Other Ambulatory Visit: Payer: Self-pay

## 2024-03-05 ENCOUNTER — Encounter (HOSPITAL_COMMUNITY): Payer: Self-pay | Admitting: *Deleted

## 2024-03-05 ENCOUNTER — Ambulatory Visit (HOSPITAL_COMMUNITY)
Admission: EM | Admit: 2024-03-05 | Discharge: 2024-03-05 | Disposition: A | Discharge status: Home / Self Care | Attending: Family Medicine | Admitting: Family Medicine

## 2024-03-05 DIAGNOSIS — L509 Urticaria, unspecified: Secondary | ICD-10-CM | POA: Diagnosis not present

## 2024-03-05 MED ORDER — METHYLPREDNISOLONE SODIUM SUCC 125 MG IJ SOLR
INTRAMUSCULAR | Status: AC
  Filled 2024-03-05: qty 2

## 2024-03-05 MED ORDER — METHYLPREDNISOLONE SODIUM SUCC 125 MG IJ SOLR
60.0000 mg | Freq: Once | INTRAMUSCULAR | Status: AC
  Administered 2024-03-05: 60 mg via INTRAMUSCULAR

## 2024-03-05 MED ORDER — METHYLPREDNISOLONE 4 MG PO TBPK: ORAL_TABLET | ORAL | 0 refills | Status: AC

## 2024-03-05 MED ORDER — HYDROXYZINE HCL 25 MG PO TABS: 25.0000 mg | ORAL_TABLET | Freq: Four times a day (QID) | ORAL | 0 refills | Status: AC | PRN

## 2024-03-05 NOTE — Discharge Instructions (Signed)
 You were given a steroid injection in the clinic today.  You may take hydroxyzine  every 6 hours as needed for your itching and hives.  Please note this medication will make you drowsy.  Do not drink alcohol or drive on this medication.  Start Medrol  Dosepak tomorrow, 8/30.  Please follow-up with your PCP if your symptoms do not improve.  Please go to the ER if you develop any worsening symptoms such as facial swelling, lip/tongue/throat swelling, difficulty breathing or swallowing, or any new concerns that arise.  Hope you feel better soon!

## 2024-03-05 NOTE — ED Triage Notes (Signed)
 Reports breaking out in hives yesterday; unk trigger. Took Benadryl yesterday. Denies any throat swelling or pruritus, denies dyspnea.

## 2024-03-05 NOTE — ED Provider Notes (Signed)
 [facility]    CSN: [id number] Arrival date & time: 03/05/24  1455      History   Chief Complaint Chief Complaint  Patient presents with   Urticaria    HPI Thomas Tucker is a 38 y.o. male presents with hives.  Patient reports yesterday he developed hives on his arms, neck, and legs.  He denies any chest pain, shortness of breath, facial swelling, lip swelling, tongue or throat swelling.  No difficulty breathing or swallowing.  Denies any new contacts.  No history of hives.  He took Benadryl which helped improve but does not completely resolve.  He does state that hives have been coming and going since yesterday.  No other concerns at this time.   Urticaria    Past Medical History:  Diagnosis Date   Myocardial infarction (HCC)     There are no active problems to display for this patient.   Past Surgical History:  Procedure Laterality Date   HERNIA REPAIR         Home Medications    Prior to Admission medications   Medication Sig Start Date End Date Taking? Authorizing Provider  hydrOXYzine  (ATARAX ) 25 MG tablet Take 1 tablet (25 mg total) by mouth every 6 (six) hours as needed for itching (Hives). 03/05/24  Yes Loreda Myla SAUNDERS, NP  methylPREDNISolone  (MEDROL  DOSEPAK) 4 MG TBPK tablet Take as prescribed on package 03/06/24  Yes Breeana Sawtelle, Jodi R, NP  doxycycline  (VIBRAMYCIN ) 100 MG capsule Take 1 capsule (100 mg total) by mouth 2 (two) times daily. 06/30/21   Billy Asberry FALCON, PA-C  ibuprofen  (ADVIL ) 800 MG tablet Take 1 tablet (800 mg total) by mouth 3 (three) times daily. Patient not taking: Reported on 03/15/2021 11/21/19   Odell Balls, PA-C  methocarbamol  (ROBAXIN ) 500 MG tablet Take 1 tablet (500 mg total) by mouth 2 (two) times daily. Patient not taking: Reported on 03/15/2021 08/17/19   Neldon Hamp RAMAN, PA  tiZANidine  (ZANAFLEX ) 4 MG tablet Take 1 tablet (4 mg total) by mouth every 6 (six) hours as needed for muscle spasms. Patient not taking: Reported on  03/15/2021 05/28/18   Maranda Jamee Jacob, MD  triamcinolone  (KENALOG ) 0.1 % paste Use as directed 1 application in the mouth or throat 2 (two) times daily. Patient not taking: Reported on 03/15/2021 06/23/19   Wieters, Hallie C, PA-C  triamcinolone  cream (KENALOG ) 0.1 % Apply 1 application topically 2 (two) times daily. Patient not taking: Reported on 03/15/2021 03/24/18   Mario Million, MD    Family History Family History  Problem Relation Age of Onset   Healthy Mother    Healthy Father     Social History Social History   Tobacco Use   Smoking status: Former    Types: Cigarettes   Smokeless tobacco: Never  Vaping Use   Vaping status: Never Used  Substance Use Topics   Alcohol use: Yes    Alcohol/week: 21.0 standard drinks of alcohol    Types: 21 Cans of beer per week   Drug use: Yes    Types: Marijuana     Allergies   Patient has no known allergies.   Review of Systems Review of Systems  Skin:        hives     Physical Exam Triage Vital Signs ED Triage Vitals  Encounter Vitals Group     BP 03/05/24 1508 (!) 166/93     Girls Systolic BP Percentile --      Girls Diastolic BP Percentile --  Boys Systolic BP Percentile --      Boys Diastolic BP Percentile --      Pulse Rate 03/05/24 1508 69     Resp 03/05/24 1508 18     Temp 03/05/24 1508 98.3 F (36.8 C)     Temp Source 03/05/24 1508 Oral     SpO2 03/05/24 1508 97 %     Weight --      Height --      Head Circumference --      Peak Flow --      Pain Score 03/05/24 1509 0     Pain Loc --      Pain Education --      Exclude from Growth Chart --    No data found.  Updated Vital Signs BP (!) 166/93   Pulse 69   Temp 98.3 F (36.8 C) (Oral)   Resp 18   SpO2 97%   Visual Acuity Right Eye Distance:   Left Eye Distance:   Bilateral Distance:    Right Eye Near:   Left Eye Near:    Bilateral Near:     Physical Exam Vitals and nursing note reviewed.  Constitutional:      General: He is not in  acute distress.    Appearance: Normal appearance. He is not ill-appearing.  HENT:     Head: Normocephalic and atraumatic.     Mouth/Throat:     Lips: Pink.     Mouth: Mucous membranes are moist. No angioedema.     Pharynx: Oropharynx is clear. Uvula midline. No pharyngeal swelling or uvula swelling.  Eyes:     Pupils: Pupils are equal, round, and reactive to light.  Cardiovascular:     Rate and Rhythm: Normal rate and regular rhythm.     Heart sounds: Normal heart sounds.  Pulmonary:     Effort: Pulmonary effort is normal.     Breath sounds: Normal breath sounds.  Skin:    General: Skin is warm and dry.  Neurological:     General: No focal deficit present.     Mental Status: He is alert and oriented to person, place, and time.  Psychiatric:        Mood and Affect: Mood normal.        Behavior: Behavior normal.      UC Treatments / Results  Labs (all labs ordered are listed, but only abnormal results are displayed) Labs Reviewed - No data to display  EKG   Radiology No results found.  Procedures Procedures (including critical care time)  Medications Ordered in UC Medications  methylPREDNISolone  sodium succinate (SOLU-MEDROL ) 125 mg/2 mL injection 60 mg (has no administration in time range)    Initial Impression / Assessment and Plan / UC Course  I have reviewed the triage vital signs and the nursing notes.  Pertinent labs & imaging results that were available during my care of the patient were reviewed by me and considered in my medical decision making (see chart for details).     Discussed with patient idiopathic urticaria.  He has no respiratory distress.  Patient given Solu-Medrol  injection in clinic and do Rx for hydroxyzine  to take as needed, side effect profile reviewed.  Will do Medrol  Dosepak to start tomorrow, 8/30.  Advise follow-up with PCP if symptoms do not improve.  Strict ER precautions reviewed and patient verbalized understanding. Final Clinical  Impressions(s) / UC Diagnoses   Final diagnoses:  Hives     Discharge Instructions  You were given a steroid injection in the clinic today.  You may take hydroxyzine  every 6 hours as needed for your itching and hives.  Please note this medication will make you drowsy.  Do not drink alcohol or drive on this medication.  Start Medrol  Dosepak tomorrow, 8/30.  Please follow-up with your PCP if your symptoms do not improve.  Please go to the ER if you develop any worsening symptoms such as facial swelling, lip/tongue/throat swelling, difficulty breathing or swallowing, or any new concerns that arise.  Hope you feel better soon!     ED Prescriptions     Medication Sig Dispense Auth. Provider   hydrOXYzine  (ATARAX ) 25 MG tablet Take 1 tablet (25 mg total) by mouth every 6 (six) hours as needed for itching (Hives). 12 tablet Alinah Sheard, Jodi R, NP   methylPREDNISolone  (MEDROL  DOSEPAK) 4 MG TBPK tablet Take as prescribed on package 21 tablet Dillian Feig, Jodi R, NP      PDMP not reviewed this encounter.   Loreda Myla SAUNDERS, NP 03/05/24 [phone]

## 2024-03-10 ENCOUNTER — Encounter (HOSPITAL_COMMUNITY): Payer: Self-pay

## 2024-03-10 ENCOUNTER — Ambulatory Visit (HOSPITAL_COMMUNITY)
Admission: EM | Admit: 2024-03-10 | Discharge: 2024-03-10 | Disposition: A | Discharge status: Home / Self Care | Source: Ambulatory Visit | Attending: Family Medicine | Admitting: Family Medicine

## 2024-03-10 DIAGNOSIS — S39012A Strain of muscle, fascia and tendon of lower back, initial encounter: Secondary | ICD-10-CM

## 2024-03-10 DIAGNOSIS — S161XXA Strain of muscle, fascia and tendon at neck level, initial encounter: Secondary | ICD-10-CM

## 2024-03-10 DIAGNOSIS — R030 Elevated blood-pressure reading, without diagnosis of hypertension: Secondary | ICD-10-CM

## 2024-03-10 MED ORDER — CYCLOBENZAPRINE HCL 10 MG PO TABS: ORAL_TABLET | ORAL | 0 refills | Status: AC

## 2024-03-10 MED ORDER — DICLOFENAC SODIUM 75 MG PO TBEC: 75.0000 mg | DELAYED_RELEASE_TABLET | Freq: Two times a day (BID) | ORAL | 0 refills | Status: AC

## 2024-03-10 NOTE — Discharge Instructions (Signed)
 Your blood pressure was noted to be elevated during your visit today. If you are currently taking medication for high blood pressure, please ensure you are taking this as directed. If you do not have a history of high blood pressure and your blood pressure remains persistently elevated, you may need to begin taking a medication at some point. You may return here within the next few days to recheck if unable to see your primary care provider or if you do not have a one.  BP (!) 196/97 (BP Location: Left Arm)   Pulse (!) 57   Temp 98.3 F (36.8 C) (Oral)   Resp 16   SpO2 98%   BP Readings from Last 3 Encounters:  03/10/24 (!) 196/97  03/05/24 (!) 166/93  06/30/21 (!) 157/85
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
